# Patient Record
Sex: Female | Born: 1975 | Race: Black or African American | Hispanic: No | Smoking: Never smoker
Health system: Southern US, Community
[De-identification: ages and names within clinical notes are randomized; demographics above are authoritative.]

## PROBLEM LIST (undated history)

## (undated) DIAGNOSIS — K219 Gastro-esophageal reflux disease without esophagitis: Secondary | ICD-10-CM

## (undated) DIAGNOSIS — K6389 Other specified diseases of intestine: Secondary | ICD-10-CM

## (undated) DIAGNOSIS — M549 Dorsalgia, unspecified: Secondary | ICD-10-CM

## (undated) HISTORY — PX: UPPER GASTROINTESTINAL ENDOSCOPY: SHX188

## (undated) HISTORY — DX: Dorsalgia, unspecified: M54.9

## (undated) HISTORY — DX: Gastro-esophageal reflux disease without esophagitis: K21.9

## (undated) HISTORY — PX: NO PAST SURGERIES: SHX2092

---
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
## 2014-05-27 ENCOUNTER — Emergency Department (HOSPITAL_BASED_OUTPATIENT_CLINIC_OR_DEPARTMENT_OTHER): Payer: Medicaid Other

## 2014-05-27 ENCOUNTER — Encounter (HOSPITAL_BASED_OUTPATIENT_CLINIC_OR_DEPARTMENT_OTHER): Payer: Self-pay | Admitting: *Deleted

## 2014-05-27 ENCOUNTER — Emergency Department (HOSPITAL_BASED_OUTPATIENT_CLINIC_OR_DEPARTMENT_OTHER)
Admission: EM | Admit: 2014-05-27 | Discharge: 2014-05-27 | Disposition: A | Discharge status: Home / Self Care | Payer: Medicaid Other | Attending: Emergency Medicine | Admitting: Emergency Medicine

## 2014-05-27 DIAGNOSIS — Y998 Other external cause status: Secondary | ICD-10-CM | POA: Insufficient documentation

## 2014-05-27 DIAGNOSIS — W228XXA Striking against or struck by other objects, initial encounter: Secondary | ICD-10-CM | POA: Insufficient documentation

## 2014-05-27 DIAGNOSIS — Y9301 Activity, walking, marching and hiking: Secondary | ICD-10-CM | POA: Insufficient documentation

## 2014-05-27 DIAGNOSIS — Y9289 Other specified places as the place of occurrence of the external cause: Secondary | ICD-10-CM | POA: Diagnosis not present

## 2014-05-27 DIAGNOSIS — S99921A Unspecified injury of right foot, initial encounter: Secondary | ICD-10-CM | POA: Diagnosis present

## 2014-05-27 DIAGNOSIS — S92514A Nondisplaced fracture of proximal phalanx of right lesser toe(s), initial encounter for closed fracture: Secondary | ICD-10-CM | POA: Insufficient documentation

## 2014-05-27 MED ORDER — HYDROCODONE-ACETAMINOPHEN 5-325 MG PO TABS
1.0000 | ORAL_TABLET | Freq: Once | ORAL | Status: AC
  Administered 2014-05-27: 1 via ORAL
  Filled 2014-05-27: qty 1

## 2014-05-27 MED ORDER — HYDROCODONE-ACETAMINOPHEN 5-325 MG PO TABS: 1.0000 | ORAL_TABLET | ORAL | Status: DC | PRN

## 2014-05-27 NOTE — ED Provider Notes (Signed)
CSN: [id number]     Arrival date & time 05/27/14  1017 History   First MD Initiated Contact with Patient 05/27/14 1022     Chief Complaint  Patient presents with  . Foot Injury     (Consider location/radiation/quality/duration/timing/severity/associated sxs/prior Treatment) HPI Comments: Pt states that she walked into a stool this morning  Patient is a 39 y.o. female presenting with foot injury. The history is provided by the patient. No language interpreter was used.  Foot Injury Location:  Toe Injury: yes   Toe location:  R third toe, R fourth toe and R little toe Pain details:    Quality:  Aching   Radiates to:  Does not radiate   Severity:  Moderate   Onset quality:  Sudden   Timing:  Constant   Progression:  Unchanged Chronicity:  New Dislocation: no   Prior injury to area:  No Relieved by:  Movement Worsened by:  Nothing tried   History reviewed. No pertinent past medical history. History reviewed. No pertinent past surgical history. No family history on file. History  Substance Use Topics  . Smoking status: Never Smoker   . Smokeless tobacco: Never Used  . Alcohol Use: No   OB History    No data available     Review of Systems  All other systems reviewed and are negative.     Allergies  Review of patient's allergies indicates no known allergies.  Home Medications   Prior to Admission medications   Not on File   BP 134/79 mmHg  Pulse 66  Temp(Src) 98 F (36.7 C) (Oral)  Resp 16  Ht 5\' 6"  (1.676 m)  Wt 180 lb (81.647 kg)  BMI 29.07 kg/m2  SpO2 100%  LMP 04/28/2014 (Approximate) Physical Exam  Constitutional: She is oriented to person, place, and time. She appears well-developed and well-nourished.  Cardiovascular: Normal rate and regular rhythm.   Pulmonary/Chest: Effort normal and breath sounds normal.  Musculoskeletal:  No gross deformity noted to toes. Pt has mild swelling of 3rd fourth and 5th toes. Neurovascularly intact   Neurological: She is alert and oriented to person, place, and time.  Skin: Skin is warm and dry.  Vitals reviewed.   ED Course  Procedures (including critical care time) Labs Review Labs Reviewed - No data to display  Imaging Review Dg Foot Complete Right  05/27/2014   CLINICAL DATA:  Stubbed toe on bar stool, initial encounter  EXAM: RIGHT FOOT COMPLETE - 3+ VIEW  COMPARISON:  None.  FINDINGS: No oblique fracture is noted through the fourth proximal phalanx without significant displacement. No other fractures are identified. No gross soft tissue abnormality is seen.  IMPRESSION: Oblique fracture through the fourth proximal phalanx.   Electronically Signed   By: Inez Catalina M.D.   On: 05/27/2014 10:58     EKG Interpretation None      MDM   Final diagnoses:  Closed nondisp fx of proximal phalanx of lesser toe of right foot, initial encounter    Pt neurovascularly intact. Will treat with hydrocodone for pain. Pt toes buddy taped and placed in post op shoe    Glendell Docker, NP 05/27/14 1117  Elnora Morrison, MD 05/29/14 [phone]

## 2014-05-27 NOTE — ED Notes (Signed)
Reports kicked a bar stool this morning while walking in a dark room- pain and bruising to right toes

## 2014-05-27 NOTE — Discharge Instructions (Signed)

## 2014-05-27 NOTE — ED Notes (Signed)
Patient transported to X-ray 

## 2014-05-27 NOTE — ED Notes (Signed)
D/c home with ride- ambulatory with crutches- directed to pharmacy to pick up meds

## 2015-04-19 ENCOUNTER — Encounter (HOSPITAL_BASED_OUTPATIENT_CLINIC_OR_DEPARTMENT_OTHER): Payer: Self-pay | Admitting: *Deleted

## 2015-04-19 ENCOUNTER — Emergency Department (HOSPITAL_BASED_OUTPATIENT_CLINIC_OR_DEPARTMENT_OTHER)
Admission: EM | Admit: 2015-04-19 | Discharge: 2015-04-19 | Disposition: A | Discharge status: Home / Self Care | Payer: Medicaid Other | Attending: Emergency Medicine | Admitting: Emergency Medicine

## 2015-04-19 DIAGNOSIS — H9209 Otalgia, unspecified ear: Secondary | ICD-10-CM | POA: Diagnosis not present

## 2015-04-19 DIAGNOSIS — K0381 Cracked tooth: Secondary | ICD-10-CM | POA: Diagnosis not present

## 2015-04-19 DIAGNOSIS — K0889 Other specified disorders of teeth and supporting structures: Secondary | ICD-10-CM | POA: Diagnosis present

## 2015-04-19 MED ORDER — PENICILLIN V POTASSIUM 500 MG PO TABS: 500.0000 mg | ORAL_TABLET | Freq: Four times a day (QID) | ORAL | Status: DC

## 2015-04-19 MED ORDER — ACETAMINOPHEN 325 MG PO TABS
650.0000 mg | ORAL_TABLET | Freq: Once | ORAL | Status: AC
  Administered 2015-04-19: 650 mg via ORAL
  Filled 2015-04-19: qty 2

## 2015-04-19 MED ORDER — NAPROXEN 250 MG PO TABS: 250.0000 mg | ORAL_TABLET | Freq: Two times a day (BID) | ORAL | Status: DC

## 2015-04-19 NOTE — ED Notes (Signed)
Pt presents with rt lower dental pain, pain radiates to jaw and also has throat pain. States she broke tooth approx 1 year ago, but had no pain, states tooth progressively breaking and now pain in intolerable. States does not have a primary dentist for care.

## 2015-04-19 NOTE — ED Notes (Signed)
Right lower dental pain.  Swelling noted.

## 2015-04-19 NOTE — ED Notes (Signed)
Denies any fevers with this episode

## 2015-04-19 NOTE — ED Notes (Signed)
DC instructions reviewed with pt and significant other, discussed Rx x 2 as written by ED provider, stressed importance of completing all abx as prescribed, also importance of making dental appoint this week with name of DDS by EDP. Opportunity for questions provided

## 2015-04-19 NOTE — ED Provider Notes (Signed)
CSN: [id number]     Arrival date & time 04/19/15  1407 History   First MD Initiated Contact with Patient 04/19/15 1457     Chief Complaint  Patient presents with  . Dental Pain    Navy Desautels is a 40 y.o. female who presents to the emergency department complaining of right lower dental pain for the past year that has worsened over the past 3 days. Patient reports her pain radiates to her right ear. She currently complains of 8 out of 10 pain. She is taking nothing for treatment today. She reports pain with chewing and cold liquids. She denies sore throat or trouble swallowing. She denies fevers, discharge from her mouth, ear discharge, double vision, headache, neck pain, neck stiffness, trouble swallowing, drooling, or vomiting.  Patient is a 40 y.o. female presenting with tooth pain. The history is provided by the patient. No language interpreter was used.  Dental Pain Associated symptoms: no congestion, no drooling, no facial swelling, no fever, no headaches and no neck pain     History reviewed. No pertinent past medical history. History reviewed. No pertinent past surgical history. History reviewed. No pertinent family history. Social History  Substance Use Topics  . Smoking status: Never Smoker   . Smokeless tobacco: Never Used  . Alcohol Use: No   OB History    No data available     Review of Systems  Constitutional: Negative for fever and chills.  HENT: Positive for dental problem and ear pain. Negative for congestion, drooling, ear discharge, facial swelling and sore throat.   Eyes: Negative for visual disturbance.  Respiratory: Negative for cough and shortness of breath.   Gastrointestinal: Negative for vomiting.  Musculoskeletal: Negative for neck pain and neck stiffness.  Skin: Negative for rash.  Neurological: Negative for light-headedness and headaches.      Allergies  Review of patient's allergies indicates no known allergies.  Home Medications   Prior to  Admission medications   Medication Sig Start Date End Date Taking? Authorizing Provider  naproxen (NAPROSYN) 250 MG tablet Take 1 tablet (250 mg total) by mouth 2 (two) times daily with a meal. 04/19/15   Waynetta Pean, PA-C  penicillin v potassium (VEETID) 500 MG tablet Take 1 tablet (500 mg total) by mouth 4 (four) times daily. 04/19/15   Waynetta Pean, PA-C   BP 121/85 mmHg  Pulse 88  Temp(Src) 98.2 F (36.8 C) (Oral)  Resp 20  Ht 5\' 6"  (1.676 m)  Wt 79.379 kg  BMI 28.26 kg/m2  SpO2 100%  LMP 04/02/2015 Physical Exam  Constitutional: She appears well-developed and well-nourished. No distress.  Non-toxic appearing.   HENT:  Head: Normocephalic and atraumatic.  Right Ear: External ear normal.  Left Ear: External ear normal.  Mouth/Throat: Oropharynx is clear and moist. No oropharyngeal exudate.  Tenderness to right lower molar which is cracked. No discharge from the mouth. No facial swelling.  Uvula is midline without edema. Soft palate rises symmetrically. No tonsillar hypertrophy or exudates. Tongue protrusion is normal. No trismus. Bilateral tympanic membranes are pearly-gray without erythema or loss of landmarks.    Eyes: Conjunctivae are normal. Pupils are equal, round, and reactive to light. Right eye exhibits no discharge. Left eye exhibits no discharge.  Neck: Normal range of motion. Neck supple. No JVD present. No tracheal deviation present.  Cardiovascular: Normal rate and intact distal pulses.   Pulmonary/Chest: Effort normal. No respiratory distress.  Lymphadenopathy:    She has no cervical adenopathy.  Neurological: She  is alert. Coordination normal.  Skin: Skin is warm and dry. No rash noted. She is not diaphoretic. No erythema. No pallor.  Psychiatric: She has a normal mood and affect. Her behavior is normal.  Nursing note and vitals reviewed.   ED Course  Procedures (including critical care time) Labs Review Labs Reviewed - No data to display  Imaging  Review No results found. .   EKG Interpretation None      Filed Vitals:   04/19/15 1412  BP: 121/85  Pulse: 88  Temp: 98.2 F (36.8 C)  TempSrc: Oral  Resp: 20  Height: 5\' 6"  (1.676 m)  Weight: 79.379 kg  SpO2: 100%     MDM   Meds given in ED:  Medications  acetaminophen (TYLENOL) tablet 650 mg (not administered)    New Prescriptions   NAPROXEN (NAPROSYN) 250 MG TABLET    Take 1 tablet (250 mg total) by mouth 2 (two) times daily with a meal.   PENICILLIN V POTASSIUM (VEETID) 500 MG TABLET    Take 1 tablet (500 mg total) by mouth 4 (four) times daily.    Final diagnoses:  Pain, dental   This is a 40 y.o. female who presents to the emergency department complaining of right lower dental pain for the past year that has worsened over the past 3 days. Patient with toothache.  No gross abscess.  Exam unconcerning for Ludwig's angina or spread of infection.  Will treat with penicillin and pain medicine.  Urged patient to follow-up with dentist Dr. Geralynn Ochs and take her discharge instructions to her appointment. I advised the patient to follow-up with their primary care provider this week. I advised the patient to return to the emergency department with new or worsening symptoms or new concerns. The patient verbalized understanding and agreement with plan.      Waynetta Pean, PA-C 04/19/15 [place], MD 04/19/15 2250

## 2015-04-19 NOTE — Discharge Instructions (Signed)

## 2019-03-09 DIAGNOSIS — K6389 Other specified diseases of intestine: Secondary | ICD-10-CM

## 2019-03-09 HISTORY — DX: Other specified diseases of intestine: K63.89

## 2019-04-23 ENCOUNTER — Other Ambulatory Visit: Payer: Self-pay

## 2019-04-23 ENCOUNTER — Emergency Department (HOSPITAL_BASED_OUTPATIENT_CLINIC_OR_DEPARTMENT_OTHER): Payer: Self-pay

## 2019-04-23 ENCOUNTER — Emergency Department (HOSPITAL_BASED_OUTPATIENT_CLINIC_OR_DEPARTMENT_OTHER)
Admission: EM | Admit: 2019-04-23 | Discharge: 2019-04-23 | Disposition: A | Discharge status: Home / Self Care | Payer: Self-pay | Attending: Emergency Medicine | Admitting: Emergency Medicine

## 2019-04-23 ENCOUNTER — Encounter (HOSPITAL_BASED_OUTPATIENT_CLINIC_OR_DEPARTMENT_OTHER): Payer: Self-pay | Admitting: *Deleted

## 2019-04-23 DIAGNOSIS — Z20822 Contact with and (suspected) exposure to covid-19: Secondary | ICD-10-CM | POA: Insufficient documentation

## 2019-04-23 DIAGNOSIS — R1900 Intra-abdominal and pelvic swelling, mass and lump, unspecified site: Secondary | ICD-10-CM | POA: Insufficient documentation

## 2019-04-23 DIAGNOSIS — R0789 Other chest pain: Secondary | ICD-10-CM | POA: Insufficient documentation

## 2019-04-23 DIAGNOSIS — B349 Viral infection, unspecified: Secondary | ICD-10-CM | POA: Insufficient documentation

## 2019-04-23 DIAGNOSIS — Z79899 Other long term (current) drug therapy: Secondary | ICD-10-CM | POA: Insufficient documentation

## 2019-04-23 LAB — CBC WITH DIFFERENTIAL/PLATELET
Abs Immature Granulocytes: 0.02 10*3/uL (ref 0.00–0.07)
Basophils Absolute: 0 10*3/uL (ref 0.0–0.1)
Basophils Relative: 0 %
Eosinophils Absolute: 0 10*3/uL (ref 0.0–0.5)
Eosinophils Relative: 0 %
HCT: 37.9 % (ref 36.0–46.0)
Hemoglobin: 11.9 g/dL — ABNORMAL LOW (ref 12.0–15.0)
Immature Granulocytes: 0 %
Lymphocytes Relative: 4 %
Lymphs Abs: 0.3 10*3/uL — ABNORMAL LOW (ref 0.7–4.0)
MCH: 25.7 pg — ABNORMAL LOW (ref 26.0–34.0)
MCHC: 31.4 g/dL (ref 30.0–36.0)
MCV: 81.9 fL (ref 80.0–100.0)
Monocytes Absolute: 0.1 10*3/uL (ref 0.1–1.0)
Monocytes Relative: 2 %
Neutro Abs: 7.1 10*3/uL (ref 1.7–7.7)
Neutrophils Relative %: 94 %
Platelets: 262 10*3/uL (ref 150–400)
RBC: 4.63 MIL/uL (ref 3.87–5.11)
RDW: 15 % (ref 11.5–15.5)
WBC: 7.6 10*3/uL (ref 4.0–10.5)
nRBC: 0 % (ref 0.0–0.2)

## 2019-04-23 LAB — COMPREHENSIVE METABOLIC PANEL
ALT: 33 U/L (ref 0–44)
AST: 35 U/L (ref 15–41)
Albumin: 4.5 g/dL (ref 3.5–5.0)
Alkaline Phosphatase: 70 U/L (ref 38–126)
Anion gap: 11 (ref 5–15)
BUN: 11 mg/dL (ref 6–20)
CO2: 25 mmol/L (ref 22–32)
Calcium: 9.5 mg/dL (ref 8.9–10.3)
Chloride: 103 mmol/L (ref 98–111)
Creatinine, Ser: 0.76 mg/dL (ref 0.44–1.00)
GFR calc Af Amer: >60 mL/min (ref >60)
GFR calc non Af Amer: >60 mL/min (ref >60)
Glucose, Bld: 99 mg/dL (ref 70–99)
Potassium: 4.2 mmol/L (ref 3.5–5.1)
Sodium: 139 mmol/L (ref 135–145)
Total Bilirubin: 0.8 mg/dL (ref 0.3–1.2)
Total Protein: 7.8 g/dL (ref 6.5–8.1)

## 2019-04-23 LAB — D-DIMER, QUANTITATIVE (NOT AT ARMC): D-Dimer, Quant: 1.04 ug/mL-FEU — ABNORMAL HIGH (ref 0.00–0.50)

## 2019-04-23 LAB — TROPONIN I (HIGH SENSITIVITY)
Troponin I (High Sensitivity): <2 ng/L (ref <18)
Troponin I (High Sensitivity): <2 ng/L (ref <18)

## 2019-04-23 LAB — SARS CORONAVIRUS 2 AG (30 MIN TAT): SARS Coronavirus 2 Ag: NEGATIVE

## 2019-04-23 MED ORDER — IOHEXOL 350 MG/ML SOLN
75.0000 mL | Freq: Once | INTRAVENOUS | Status: AC | PRN
  Administered 2019-04-23: 75 mL via INTRAVENOUS

## 2019-04-23 MED ORDER — PROMETHAZINE HCL 25 MG/ML IJ SOLN
25.0000 mg | Freq: Once | INTRAMUSCULAR | Status: AC
  Administered 2019-04-23: 25 mg via INTRAVENOUS
  Filled 2019-04-23: qty 1

## 2019-04-23 MED ORDER — ONDANSETRON 4 MG PO TBDP: 4.0000 mg | ORAL_TABLET | Freq: Three times a day (TID) | ORAL | 0 refills | Status: DC | PRN

## 2019-04-23 MED ORDER — ACETAMINOPHEN 325 MG PO TABS
650.0000 mg | ORAL_TABLET | Freq: Once | ORAL | Status: AC
  Administered 2019-04-23: 650 mg via ORAL
  Filled 2019-04-23: qty 2

## 2019-04-23 MED ORDER — SODIUM CHLORIDE 0.9 % IV SOLN: Freq: Once | INTRAVENOUS | Status: AC

## 2019-04-23 MED ORDER — IOHEXOL 300 MG/ML  SOLN: 75.0000 mL | Freq: Once | INTRAMUSCULAR | Status: DC | PRN

## 2019-04-23 MED ORDER — IOHEXOL 300 MG/ML  SOLN
100.0000 mL | Freq: Once | INTRAMUSCULAR | Status: AC | PRN
  Administered 2019-04-23: 80 mL via INTRAVENOUS

## 2019-04-23 MED ORDER — SODIUM CHLORIDE 0.9 % IV BOLUS
1000.0000 mL | Freq: Once | INTRAVENOUS | Status: AC
  Administered 2019-04-23: 1000 mL via INTRAVENOUS

## 2019-04-23 NOTE — Discharge Instructions (Addendum)
You were seen today for vomiting and chest discomfort.  Your work-up is reassuring.  You did develop a fever while in the emergency room.  You may have a virus causing your symptoms.  Your initial COVID-19 test was negative.  Repeat testing was sent.  You should quarantine until repeat testing returns which should be tomorrow.  Take Zofran as needed for any nausea or vomiting.  Make sure that you are staying hydrated.  Your CT scans unfortunately show a mass in your abdomen. We cannot tell for sure what it is based on the imaging but cancer is a possibility. Certain tumors could also cause the symptoms you are experiencing.   Take ibuprofen/tylenol as needed for fever. Zofran and as needed for nausea. You need to make an appointment with oncology to discuss further and you will likely end up needing a biopsy of this mass to make a definitive diagnosis. Return to the ER if you feel like your symptoms are significantly worsening in the mean time.

## 2019-04-23 NOTE — ED Triage Notes (Addendum)
C/o vomiting times 4 that started a few hours ago. C/o feeling " chilled" and short of breath. C/o anterior chest pain. States she feels like something is "stuck in her chest" denies any urinary symptoms. States pain is constant.

## 2019-04-23 NOTE — ED Notes (Signed)
Pt given water for PO challenge. Reports nausea has subsided. Ambulated to bathroom with staff.

## 2019-04-23 NOTE — ED Provider Notes (Signed)
[facility] EMERGENCY DEPARTMENT Provider Note   CSN: [id number] Arrival date & time: 04/23/19  0051     History Chief Complaint  Patient presents with  . chest pain and vomiting    Amy Frost is a 44 y.o. female.  HPI    This is a 44 year old female with no reported past medical history who presents with multiple episodes of nonbilious, nonbloody emesis.  Patient reports onset of symptoms abruptly several hours ago.  She states she began to have chills and felt like she needed to go to bed.  She developed some shortness of breath and anterior chest pain.  She states "I feel like something is just sitting there."  She has had several episodes of nonbilious, nonbloody emesis.  She denies any recent cough or shortness of breath.  Denies any known Covid exposures.  History reviewed. No pertinent past medical history.  There are no problems to display for this patient.   History reviewed. No pertinent surgical history.   OB History   No obstetric history on file.     No family history on file.  Social History   Tobacco Use  . Smoking status: Never Smoker  . Smokeless tobacco: Never Used  Substance Use Topics  . Alcohol use: No  . Drug use: No    Home Medications Prior to Admission medications   Medication Sig Start Date End Date Taking? Authorizing Provider  naproxen (NAPROSYN) 250 MG tablet Take 1 tablet (250 mg total) by mouth 2 (two) times daily with a meal. 04/19/15   Waynetta Pean, PA-C  ondansetron (ZOFRAN ODT) 4 MG disintegrating tablet Take 1 tablet (4 mg total) by mouth every 8 (eight) hours as needed. 04/23/19   [place] Moss, Barbette Hair, MD  penicillin v potassium (VEETID) 500 MG tablet Take 1 tablet (500 mg total) by mouth 4 (four) times daily. 04/19/15   Waynetta Pean, PA-C    Allergies    Patient has no known allergies.  Review of Systems   Review of Systems  Constitutional: Positive for chills. Negative for fever.  Respiratory: Positive for  shortness of breath.   Cardiovascular: Positive for chest pain. Negative for leg swelling.  Gastrointestinal: Positive for nausea and vomiting. Negative for abdominal pain, constipation and diarrhea.  Genitourinary: Negative for dysuria.  All other systems reviewed and are negative.   Physical Exam Updated Vital Signs BP 104/68 (BP Location: Right Arm)   Pulse 94   Temp (!) 101.3 F (38.5 C)   Resp 20   Ht 1.651 m (5\' 5" )   Wt 81.6 kg   LMP  (LMP Unknown)   SpO2 98%   BMI 29.95 kg/m   Physical Exam Vitals and nursing note reviewed.  Constitutional:      Appearance: She is well-developed.     Comments: Overweight  HENT:     Head: Normocephalic and atraumatic.     Mouth/Throat:     Mouth: Mucous membranes are moist.  Eyes:     Pupils: Pupils are equal, round, and reactive to light.  Cardiovascular:     Rate and Rhythm: Regular rhythm. Tachycardia present.     Heart sounds: Normal heart sounds.  Pulmonary:     Effort: Pulmonary effort is normal. No respiratory distress.     Breath sounds: No wheezing.  Chest:     Chest wall: No tenderness.  Abdominal:     General: Bowel sounds are normal.     Palpations: Abdomen is soft.     Tenderness:  There is no abdominal tenderness. There is no guarding or rebound.  Musculoskeletal:     Cervical back: Neck supple.     Right lower leg: No edema.     Left lower leg: No edema.  Skin:    General: Skin is warm and dry.  Neurological:     Mental Status: She is alert and oriented to person, place, and time.  Psychiatric:        Mood and Affect: Mood normal.     ED Results / Procedures / Treatments   Labs (all labs ordered are listed, but only abnormal results are displayed) Labs Reviewed  CBC WITH DIFFERENTIAL/PLATELET - Abnormal; Notable for the following components:      Result Value   Hemoglobin 11.9 (*)    MCH 25.7 (*)    Lymphs Abs 0.3 (*)    All other components within normal limits  D-DIMER, QUANTITATIVE (NOT AT  [facility]) - Abnormal; Notable for the following components:   D-Dimer, Quant 1.04 (*)    All other components within normal limits  SARS CORONAVIRUS 2 AG (30 MIN TAT)  NOVEL CORONAVIRUS, NAA (HOSP ORDER, SEND-OUT TO REF LAB; TAT 18-24 HRS)  COMPREHENSIVE METABOLIC PANEL  TROPONIN I (HIGH SENSITIVITY)  TROPONIN I (HIGH SENSITIVITY)    EKG EKG Interpretation  Date/Time:  Monday April 23 2019 01:17:06 EST Ventricular Rate:  109 PR Interval:    QRS Duration: 81 QT Interval:  326 QTC Calculation: 439 R Axis:   48 Text Interpretation: Sinus tachycardia Borderline T abnormalities, diffuse leads Confirmed by Thayer Jew [phone]) on 04/23/2019 1:24:10 AM   Radiology CT Angio Chest PE W and/or Wo Contrast  Result Date: 04/23/2019 CLINICAL DATA:  Shortness of breath EXAM: CT ANGIOGRAPHY CHEST WITH CONTRAST TECHNIQUE: Multidetector CT imaging of the chest was performed using the standard protocol during bolus administration of intravenous contrast. Multiplanar CT image reconstructions and MIPs were obtained to evaluate the vascular anatomy. CONTRAST:  54mL OMNIPAQUE IOHEXOL 350 MG/ML SOLN COMPARISON:  None. FINDINGS: Cardiovascular: Evaluation for pulmonary emboli was limited by respiratory motion artifact.Given this limitation, no definite acute pulmonary embolism was detected. The main pulmonary artery is within normal limits for size. There is no CT evidence of acute right heart strain. The visualized aorta is normal. Heart size is normal, without pericardial effusion. Mediastinum/Nodes: --No mediastinal or hilar lymphadenopathy. --No axillary lymphadenopathy. --No supraclavicular lymphadenopathy. --Normal thyroid gland. --The esophagus is unremarkable Lungs/Pleura: No pulmonary nodules or masses. No pleural effusion or pneumothorax. No focal airspace consolidation. No focal pleural abnormality. Upper Abdomen: There is a hypoattenuating 2.9 by 2.7 cm lesion in hepatic segment 8/7 (axial series 6,  image 57). There is suggestion of peripheral nodular hyperenhancement surrounding this lesion and is therefore favored to represent a benign hemangioma.There is a hyperenhancing 5.2 by 3 cm mass in the anterior abdomen, anterior to the SMA, in the root of the mesentery. There are multiple enlarged draining veins that appear to connect to the portal venous confluence. Musculoskeletal: No chest wall abnormality. No acute or significant osseous findings. Review of the MIP images confirms the above findings. IMPRESSION: 1. Limited study for the detection of pulmonary emboli as detailed above. Given this limitation, no acute pulmonary embolism was detected. 2. There is a 5.2 x 3 cm hyperenhancing mass in the abdomen as detailed above. Differential considerations include but are not limited to a desmoid or carcinoid tumor. 3. Indeterminate 3 cm mass in the right hepatic lobe. Follow-up with a nonemergent contrast-enhanced MRI is  recommended. Electronically Signed   By: Constance Holster M.D.   On: 04/23/2019 03:19   DG Chest Portable 1 View  Result Date: 04/23/2019 CLINICAL DATA:  Chest pain. Chills and shortness of breath. Vomiting. EXAM: PORTABLE CHEST 1 VIEW COMPARISON:  None. FINDINGS: The cardiomediastinal contours are normal. The lungs are clear. Pulmonary vasculature is normal. No consolidation, pleural effusion, or pneumothorax. No acute osseous abnormalities are seen. IMPRESSION: Negative AP view of the chest. Electronically Signed   By: Keith Rake M.D.   On: 04/23/2019 01:57    Procedures Procedures (including critical care time)  Medications Ordered in ED Medications  iohexol (OMNIPAQUE) 300 MG/ML solution 75 mL (has no administration in time range)  sodium chloride 0.9 % bolus 1,000 mL (1,000 mLs Intravenous New Bag/Given 04/23/19 0139)  promethazine (PHENERGAN) injection 25 mg (25 mg Intravenous Given 04/23/19 0200)  acetaminophen (TYLENOL) tablet 650 mg (650 mg Oral Given 04/23/19 0218)    iohexol (OMNIPAQUE) 350 MG/ML injection 75 mL (75 mLs Intravenous Contrast Given 04/23/19 0226)    ED Course  I have reviewed the triage vital signs and the nursing notes.  Pertinent labs & imaging results that were available during my care of the patient were reviewed by me and considered in my medical decision making (see chart for details).  Clinical Course as of Apr 23 443  Mon Apr 23, 2019  [id number] Patient with CT negative for PE.  However, upper abdomen with concerning mass.  This may or may not be related to her presentation today.  She does not have insurance or primary physician.  Will obtain CT to better characterize.  Unfortunately, patient received 2 boluses of contrast for her PE study.  Per radiology, she needs to be hydrated and cannot receive another bolus for at least 8 hours from her prior scan.  Given that she does not have definitive follow-up or primary physician and is without insurance, feel it necessary to characterize this mass to appropriately provide patient with follow-up instructions.   [CH]    Clinical Course User Index [CH] Sharne Linders, Barbette Hair, MD   MDM Rules/Calculators/A&P                       Patient presents with fairly acute onset nausea, vomiting, chest pain.  She is overall nontoxic and initial vital signs are reassuring.  She is initially afebrile.  However, she continued to endorse chills.  Temperature on recheck was 101.3.  She denies any Covid exposures and is low risk.  Initial Covid testing is negative.  EKG is nonischemic and without arrhythmia.  Chest x-ray without pneumothorax or pneumonia.  Given her tachycardia, D-dimer was obtained and is slightly elevated.  CT PE study obtained.  Her basic lab work is largely unremarkable.  No significant metabolic derangements.  CT was limited but no obvious PE.  However, she was found to have incidental abdominal mass and a liver lesion.  For this reason, I ordered CT of her abdomen.  See clinical course above.   Patient will be hydrated for repeat CT at 11 AM.  Final Clinical Impression(s) / ED Diagnoses Final diagnoses:  Viral syndrome  Abdominal mass, unspecified abdominal location    Rx / DC Orders ED Discharge Orders         Ordered    ondansetron (ZOFRAN ODT) 4 MG disintegrating tablet  Every 8 hours PRN     04/23/19 0423  Merryl Hacker, MD 04/23/19 [phone]

## 2019-04-24 LAB — NOVEL CORONAVIRUS, NAA (HOSP ORDER, SEND-OUT TO REF LAB; TAT 18-24 HRS): SARS-CoV-2, NAA: NOT DETECTED

## 2019-04-25 ENCOUNTER — Telehealth: Payer: Self-pay | Admitting: Hematology

## 2019-04-25 NOTE — Telephone Encounter (Signed)
Received a call from the pt who was seen in the ED for an abdominal mass. She has been scheduled to see Dr. Burr Medico on 2/24 at 3pm. Pt aware to arrive 15 minutes early.

## 2019-04-26 ENCOUNTER — Other Ambulatory Visit: Payer: Self-pay | Admitting: Oncology

## 2019-04-26 DIAGNOSIS — K6389 Other specified diseases of intestine: Secondary | ICD-10-CM

## 2019-04-26 NOTE — Progress Notes (Signed)
I sent a request to our scheduler for patient to be seen 02/26 with labs that day

## 2019-04-27 NOTE — Progress Notes (Signed)
[place]   [phone] [phone]   Clinic New Consult Note   Patient Care Team: Patient, No Pcp Per as PCP - General (General Practice) Jonnie Finner, RN as Oncology Nurse Navigator  Date of Service:  05/02/2019   CHIEF COMPLAINTS/PURPOSE OF CONSULTATION:  Mesenteric Mass   REFERRING PHYSICIAN:  ED physician   HISTORY OF PRESENTING ILLNESS:  Amy Frost 44 y.o. female is a here because of mesenteric mass. The patient was referred by Hospitalist. The patient presents to the clinic today alone.   She was having N&V and SOB with chest tightness and had extreme chills. She did not initially have fever but had one in ER. She denies any other symptoms prior to the day she went to hospital. She did cook and eat shrimp that day. Her symptoms resolved after a few hours and IVFs. Since that day she  not had any other GI issues except bad gas that resolved with Rolaids. She denies diarrhea/constipation or blood in stool. She notes overall she has no current issues.    Socially married with 7 children ranging from age 66-17 that live with her. She does not smoke or drink alcohol. She is currently not working. She previously worked for [employer]. Her husband is a Administrator.   She denies any medical issues or on medication. After she lost her insurance she has not seen PCP or Gyn. 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 She has never had surgery before. She denies family history of malignancy or disease running in her family. She notes she had a heart murmur in the past. She notes her periods last 3-4 days and are only heavy every other month and last period was very bight red.    REVIEW OF SYSTEMS:    Constitutional: Denies fevers, chills or abnormal night sweats Eyes: Denies blurriness of vision, double vision or watery eyes Ears, nose, mouth, throat, and face: Denies mucositis or sore throat Respiratory: Denies cough, dyspnea or  wheezes Cardiovascular: Denies palpitation, chest discomfort or lower extremity swelling Gastrointestinal:  Denies nausea, heartburn or change in bowel habits Skin: Denies abnormal skin rashes Lymphatics: Denies new lymphadenopathy or easy bruising Neurological:Denies numbness, tingling or new weaknesses Behavioral/Psych: Mood is stable, no new changes  All other systems were reviewed with the patient and are negative.   MEDICAL HISTORY:  History reviewed. No pertinent past medical history.  SURGICAL HISTORY: History reviewed. No pertinent surgical history.  SOCIAL HISTORY: Social History   Socioeconomic History  . Marital status: Married    Spouse name: Not on file  . Number of children: 7  . Years of education: Not on file  . Highest education level: Not on file  Occupational History  . Not on file  Tobacco Use  . Smoking status: Never Smoker  . Smokeless tobacco: Never Used  Substance and Sexual Activity  . Alcohol use: No  . Drug use: No  . Sexual activity: Not on file  Other Topics Concern  . Not on file  Social History Narrative  . Not on file   Social Determinants of Health   Financial Resource Strain:   . Difficulty of Paying Living Expenses: Not on file  Food Insecurity:   . Worried About Charity fundraiser in the Last Year: Not on file  . Ran Out of Food in the Last Year: Not on file  Transportation Needs:   . Lack of Transportation (Medical):  Not on file  . Lack of Transportation (Non-Medical): Not on file  Physical Activity:   . Days of Exercise per Week: Not on file  . Minutes of Exercise per Session: Not on file  Stress:   . Feeling of Stress : Not on file  Social Connections:   . Frequency of Communication with Friends and Family: Not on file  . Frequency of Social Gatherings with Friends and Family: Not on file  . Attends Religious Services: Not on file  . Active Member of Clubs or Organizations: Not on file  . Attends Archivist  Meetings: Not on file  . Marital Status: Not on file  Intimate Partner Violence:   . Fear of Current or Ex-Partner: Not on file  . Emotionally Abused: Not on file  . Physically Abused: Not on file  . Sexually Abused: Not on file    FAMILY HISTORY: History reviewed. No pertinent family history.  ALLERGIES:  has No Known Allergies.  MEDICATIONS:  Current Outpatient Medications  Medication Sig Dispense Refill  . naproxen (NAPROSYN) 250 MG tablet Take 1 tablet (250 mg total) by mouth 2 (two) times daily with a meal. 30 tablet 0  . ondansetron (ZOFRAN ODT) 4 MG disintegrating tablet Take 1 tablet (4 mg total) by mouth every 8 (eight) hours as needed. 20 tablet 0   No current facility-administered medications for this visit.    PHYSICAL EXAMINATION: ECOG PERFORMANCE STATUS: 0 - Asymptomatic  Vitals:   05/02/19 1446  BP: 135/84  Pulse: 73  Resp: 18  Temp: 98.2 F (36.8 C)  SpO2: 100%   Filed Weights   05/02/19 1446  Weight: 214 lb 6.4 oz (97.3 kg)    GENERAL:alert, no distress and comfortable SKIN: skin color, texture, turgor are normal, no rashes or significant lesions EYES: normal, Conjunctiva are pink and non-injected, sclera clear  NECK: supple, thyroid normal size, non-tender, without nodularity LYMPH:  no palpable lymphadenopathy in the cervical, axillary  LUNGS: clear to auscultation and percussion with normal breathing effort HEART: regular rate & rhythm and no murmurs and no lower extremity edema ABDOMEN:abdomen soft, non-tender and normal bowel sounds Musculoskeletal:no cyanosis of digits and no clubbing  NEURO: alert & oriented x 3 with fluent speech, no focal motor/sensory deficits  LABORATORY DATA:  I have reviewed the data as listed CBC Latest Ref Rng & Units 04/23/2019  WBC 4.0 - 10.5 K/uL 7.6  Hemoglobin 12.0 - 15.0 g/dL 11.9(L)  Hematocrit 36.0 - 46.0 % 37.9  Platelets 150 - 400 K/uL 262    CMP Latest Ref Rng & Units 04/23/2019  Glucose 70 - 99  mg/dL 99  BUN 6 - 20 mg/dL 11  Creatinine 0.44 - 1.00 mg/dL 0.76  Sodium 135 - 145 mmol/L 139  Potassium 3.5 - 5.1 mmol/L 4.2  Chloride 98 - 111 mmol/L 103  CO2 22 - 32 mmol/L 25  Calcium 8.9 - 10.3 mg/dL 9.5  Total Protein 6.5 - 8.1 g/dL 7.8  Total Bilirubin 0.3 - 1.2 mg/dL 0.8  Alkaline Phos 38 - 126 U/L 70  AST 15 - 41 U/L 35  ALT 0 - 44 U/L 33    CT Angio Chest 04/23/19  IMPRESSION: 1. Limited study for the detection of pulmonary emboli as detailed above. Given this limitation, no acute pulmonary embolism was detected. 2. There is a 5.2 x 3 cm hyperenhancing mass in the abdomen as detailed above. Differential considerations include but are not limited to a desmoid or carcinoid tumor. 3.  Indeterminate 3 cm mass in the right hepatic lobe. Follow-up with a nonemergent contrast-enhanced MRI is recommended.    CT AP W contrast 04/23/19  IMPRESSION: 1. No acute findings identified within the abdomen or pelvis. 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 This is of uncertain etiology. Differential considerations include desmoid tumor, carcinoid tumor, GIST, lymphoma, or metastatic adenopathy. 2. There is a low-density mass with peripheral enhancement within the dome of liver which is favored to represent a benign abnormality such as hemangioma. This could be confirmed with nonemergent contrast enhanced liver protocol MRI.   RADIOGRAPHIC STUDIES: I have personally reviewed the radiological images as listed and agreed with the findings in the report. CT Angio Chest PE W and/or Wo Contrast  Result Date: 04/23/2019 CLINICAL DATA:  Shortness of breath EXAM: CT ANGIOGRAPHY CHEST WITH CONTRAST TECHNIQUE: Multidetector CT imaging of the chest was performed using the standard protocol during bolus administration of intravenous contrast. Multiplanar CT image reconstructions and MIPs were obtained to evaluate the vascular anatomy. CONTRAST:  34mL OMNIPAQUE IOHEXOL 350 MG/ML SOLN COMPARISON:   None. FINDINGS: Cardiovascular: Evaluation for pulmonary emboli was limited by respiratory motion artifact.Given this limitation, no definite acute pulmonary embolism was detected. The main pulmonary artery is within normal limits for size. There is no CT evidence of acute right heart strain. The visualized aorta is normal. Heart size is normal, without pericardial effusion. Mediastinum/Nodes: --No mediastinal or hilar lymphadenopathy. --No axillary lymphadenopathy. --No supraclavicular lymphadenopathy. --Normal thyroid gland. --The esophagus is unremarkable Lungs/Pleura: No pulmonary nodules or masses. No pleural effusion or pneumothorax. No focal airspace consolidation. No focal pleural abnormality. Upper Abdomen: There is a hypoattenuating 2.9 by 2.7 cm lesion in hepatic segment 8/7 (axial series 6, image 57). There is suggestion of peripheral nodular hyperenhancement surrounding this lesion and is therefore favored to represent a benign hemangioma.There is a hyperenhancing 5.2 by 3 cm mass in the anterior abdomen, anterior to the SMA, in the root of the mesentery. There are multiple enlarged draining veins that appear to connect to the portal venous confluence. Musculoskeletal: No chest wall abnormality. No acute or significant osseous findings. Review of the MIP images confirms the above findings. IMPRESSION: 1. Limited study for the detection of pulmonary emboli as detailed above. Given this limitation, no acute pulmonary embolism was detected. 2. There is a 5.2 x 3 cm hyperenhancing mass in the abdomen as detailed above. Differential considerations include but are not limited to a desmoid or carcinoid tumor. 3. Indeterminate 3 cm mass in the right hepatic lobe. Follow-up with a nonemergent contrast-enhanced MRI is recommended. Electronically Signed   By: Constance Holster M.D.   On: 04/23/2019 03:19   CT ABDOMEN PELVIS W CONTRAST  Result Date: 04/23/2019 CLINICAL DATA:  Vomiting. Short of breath.  Anterior chest pain. Abdominal mass EXAM: CT ABDOMEN AND PELVIS WITH CONTRAST TECHNIQUE: Multidetector CT imaging of the abdomen and pelvis was performed using the standard protocol following bolus administration of intravenous contrast. CONTRAST:  59mL OMNIPAQUE IOHEXOL 300 MG/ML  SOLN COMPARISON:  None. FINDINGS: Lower chest: No acute abnormality. Hepatobiliary: Within the lateral dome of liver there is a low-density lesion with peripheral and nodular enhancement which measures 3.0 x 3.0 cm, image 7/2. The focal fatty deposition noted adjacent to the falciform ligament. Vicarious excretion of contrast noted within the gallbladder. No gallbladder wall inflammation or biliary ductal dilatation. Pancreas: Unremarkable. No pancreatic ductal dilatation or surrounding inflammatory changes. Spleen: Normal in size without focal abnormality. Adrenals/Urinary Tract: Normal adrenal glands. The kidneys are  unremarkable. No mass or hydronephrosis identified bilaterally. Small low-density structure within posterior cortex of left kidney is too small to characterize measuring 7 mm, image 19/7. The urinary bladder is unremarkable. Stomach/Bowel: Stomach is normal. The small bowel loops are unremarkable. The appendix is visualized and appears normal. No wall thickening, inflammation or abnormal distension. Vascular/Lymphatic: Abdominal aorta is unremarkable. Soft tissue mass within the central mesentery is again identified. This measures 5.4 x 2.8 by 5.9 cm (volume = 47 cm^3). Reproductive: Uterus and bilateral adnexa are unremarkable. Other: There is no ascites or focal fluid collection identified within the abdomen or pelvis. Musculoskeletal: No acute or significant osseous findings. IMPRESSION: 1. No acute findings identified within the abdomen or pelvis. 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 This is of uncertain etiology. Differential considerations include desmoid tumor, carcinoid tumor, GIST, lymphoma, or metastatic  adenopathy. 2. There is a low-density mass with peripheral enhancement within the dome of liver which is favored to represent a benign abnormality such as hemangioma. This could be confirmed with nonemergent contrast enhanced liver protocol MRI. Electronically Signed   By: Kerby Moors M.D.   On: 04/23/2019 11:42   DG Chest Portable 1 View  Result Date: 04/23/2019 CLINICAL DATA:  Chest pain. Chills and shortness of breath. Vomiting. EXAM: PORTABLE CHEST 1 VIEW COMPARISON:  None. FINDINGS: The cardiomediastinal contours are normal. The lungs are clear. Pulmonary vasculature is normal. No consolidation, pleural effusion, or pneumothorax. No acute osseous abnormalities are seen. IMPRESSION: Negative AP view of the chest. Electronically Signed   By: Keith Rake M.D.   On: 04/23/2019 01:57    ASSESSMENT & PLAN:  Elaine Ratterree is a 44 y.o. African-American female with no significant PMHx.     1. Mesenteric Mass  -this is likely incidental finding. I personally reviewed and discussed her image findings. Her CT angio chest and CT AP show 5.9cm mesenteric mass. It does not appear to be involving attached to her small bowel, could be a lymph node but no other adenopathy on CT  -I discussed this could be benign such as desmoid tumor or Malignant, such as low grade lymphoma, GIST, Neuroendocrine tumor or metastatic cancer. I reviewed her case in GI tumor Board this morning and needle biopsy is recommended for further evaluation. She is agreeable.  -She did have an episode of N&V and chest tightness which has completely resolved. This may not be related to this mass.  -I discussed if she is found to have malignancy, treatment will be offered. Due to the location, complete surgical resection may not be easy.  If mass is benign, we will likely just monitor it.  -Physical exam unremarkable. Labs reviewed from last week, Hg 11.9 otherwise normal.  -Will F/u with phone call after her biopsy.    2. Mild  Anemia  -She notes to having heavy menses every other month.  -Labs from 04/23/19 show Hg 11.9.  -I recommend she take multivitamin with iron.    3. Financial support -She previously worked for [employer]. She is currently not working. She is nervous about working outside of home  -She has not had medical insurance for the past 2 years and has not been seen by PCP or Gyn. She did apply for insurance in 01/2019 with [employer] but nor sure if they cover anything.  -I discussed she can speak with our financial advocate or SW about more information.  -I recommend she establish a PCP under [facility]    4. Over all health, Cancer screenings -She  is overall healthy with no significant PMHx or FMHx. 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 She has not had surgery for removal as this is cosmetic surgery that her insurance does not cover -She has not had a mammogram before. She is of age and I can look into assistance or her insurance coverage for her to have a Mammogram yearly. She is interested.    PLAN:  -IR CT biopsy of mesenteric mass in 1-2 weeks  -phone visit in 2-3 days after biopsy    Orders Placed This Encounter  Procedures  . CT Biopsy    Standing Status:   Future    Standing Expiration Date:   05/01/2020    Scheduling Instructions:     Case discussed in GI conference this morning, Dr. Annamaria Boots approved the biopsy    Order Specific Question:   Lab orders requested (DO NOT place separate lab orders, these will be automatically ordered during procedure specimen collection):    Answer:   Surgical Pathology    Order Specific Question:   Reason for Exam (SYMPTOM  OR DIAGNOSIS REQUIRED)    Answer:   mesentary mass, biopsy for tussue dignosis rule out malignancy    Order Specific Question:   Is patient pregnant?    Answer:   No    Order Specific Question:   Preferred location?    Answer:   [facility]    Order Specific Question:   Radiology Contrast Protocol - do NOT remove file path     Answer:   [email or web address]    All questions were answered. The patient knows to call the clinic with any problems, questions or concerns. The total time spent in the appointment was 50 minutes.     Truitt Merle, MD 05/02/2019 11:14 PM  I, Joslyn Devon, am acting as scribe for Truitt Merle, MD.   I have reviewed the above documentation for accuracy and completeness, and I agree with the above.

## 2019-05-02 ENCOUNTER — Inpatient Hospital Stay: Payer: Self-pay | Attending: Hematology | Admitting: Hematology

## 2019-05-02 ENCOUNTER — Other Ambulatory Visit: Payer: Self-pay

## 2019-05-02 ENCOUNTER — Encounter: Payer: Self-pay | Admitting: Hematology

## 2019-05-02 VITALS — BP 135/84 | HR 73 | Temp 98.2°F | Resp 18 | Ht 65.5 in | Wt 214.4 lb

## 2019-05-02 DIAGNOSIS — R160 Hepatomegaly, not elsewhere classified: Secondary | ICD-10-CM | POA: Insufficient documentation

## 2019-05-02 DIAGNOSIS — R222 Localized swelling, mass and lump, trunk: Secondary | ICD-10-CM | POA: Insufficient documentation

## 2019-05-02 DIAGNOSIS — Z79899 Other long term (current) drug therapy: Secondary | ICD-10-CM | POA: Insufficient documentation

## 2019-05-02 DIAGNOSIS — D649 Anemia, unspecified: Secondary | ICD-10-CM | POA: Insufficient documentation

## 2019-05-02 DIAGNOSIS — D171 Benign lipomatous neoplasm of skin and subcutaneous tissue of trunk: Secondary | ICD-10-CM | POA: Insufficient documentation

## 2019-05-02 DIAGNOSIS — N920 Excessive and frequent menstruation with regular cycle: Secondary | ICD-10-CM | POA: Insufficient documentation

## 2019-05-02 DIAGNOSIS — K6389 Other specified diseases of intestine: Secondary | ICD-10-CM

## 2019-05-02 NOTE — Progress Notes (Signed)
Spoke with patient at her initial appointment with Dr. Burr Medico.  She was given my direct contact number.  She is worried about insurance and how things will be paid for.  I reassured her there are resources.

## 2019-05-03 ENCOUNTER — Encounter: Payer: Self-pay | Admitting: Hematology

## 2019-05-04 ENCOUNTER — Telehealth: Payer: Self-pay | Admitting: Hematology

## 2019-05-04 NOTE — Telephone Encounter (Signed)
Scheduled appt per 2/24 los.  Spoke with pt and she is aware of her phone visit appt date and time.

## 2019-05-08 ENCOUNTER — Telehealth (HOSPITAL_COMMUNITY): Payer: Self-pay

## 2019-05-09 ENCOUNTER — Telehealth: Payer: Self-pay

## 2019-05-09 ENCOUNTER — Encounter (HOSPITAL_COMMUNITY): Payer: Self-pay

## 2019-05-09 NOTE — Telephone Encounter (Signed)
Received VM from staff member at [facility] stating they have not been able to get in contact with patient to get her biopsy scheduled that Dr. Burr Medico ordered. Called patient number and left a voicemail with scheduling departments direct number [phone]) for patient to call back and have appointment scheduled. Encouraged patient to call office number back if she had and questions/concerns.

## 2019-05-09 NOTE — Progress Notes (Unsigned)
[employer] Female, 44 y.o., [birth year] MRN:  [id number] Phone:  [phone] Jerilynn Mages) PCP:  Patient, No Pcp Per Primary Cvg:  None Next Appt With Radiology ([facility]-CT 1) 05/18/2019 at 9:00 AM  RE: Biopsy Received: Yesterday Message Contents  Arne Cleveland, MD  Lenore Cordia      Ok   CT core mesenteric mass  Lymphoma v GIST etc  [facility]   Previous Messages  ----- Message -----  From: Lenore Cordia  Sent: 05/08/2019  8:45 AM EST  To: [facility] Procedure Requests  Subject: Biopsy                      Procedure Requested: CT Biopsy    Reason for Procedure: mesentary mass, biopsy for tussue dignosis rule out malignancy    Provider Requesting: Truitt Merle   Provider Telephone: [phone]    Other Info: Rad exams in Epic

## 2019-05-10 ENCOUNTER — Telehealth: Payer: Self-pay | Admitting: Hematology

## 2019-05-10 NOTE — Telephone Encounter (Signed)
Rescheduled appt to 3/18 per 3/4 sch msg. Left voicemail with appt details.

## 2019-05-16 NOTE — Progress Notes (Addendum)
[name]   [phone] [phone]   Clinic Follow up Note   Patient Care Team: Patient, No Pcp Per as PCP - General (General Practice) Jonnie Finner, RN as Oncology Nurse Navigator   I connected with Amy Frost on 05/24/2019 at  9:00 AM EDT by telephone visit and verified that I am speaking with the correct person using two identifiers.  I discussed the limitations, risks, security and privacy concerns of performing an evaluation and management service by telephone and the availability of in person appointments. I also discussed with the patient that there may be a patient responsible charge related to this service. The patient expressed understanding and agreed to proceed.   Patient's location:  Her home Provider's location:   My Office  CHIEF COMPLAINT: F/u of Mesenteric Mass   INTERVAL HISTORY:  Amy Frost is here for a follow up. They identified themselves by birth date. She notes her biopsy procedure went well. She denies any abdominal pain.    REVIEW OF SYSTEMS:   Constitutional: Denies fevers, chills or abnormal weight loss Eyes: Denies blurriness of vision Ears, nose, mouth, throat, and face: Denies mucositis or sore throat Respiratory: Denies cough, dyspnea or wheezes Cardiovascular: Denies palpitation, chest discomfort or lower extremity swelling Gastrointestinal:  Denies nausea, heartburn or change in bowel habits Skin: Denies abnormal skin rashes Lymphatics: Denies new lymphadenopathy or easy bruising Neurological:Denies numbness, tingling or new weaknesses Behavioral/Psych: Mood is stable, no new changes  All other systems were reviewed with the patient and are negative.  MEDICAL HISTORY:  History reviewed. No pertinent past medical history.  SURGICAL HISTORY: History reviewed. No pertinent surgical history.  I have reviewed the social history and family history with the patient and they are unchanged from previous  note.  ALLERGIES:  has No Known Allergies.  MEDICATIONS:  Current Outpatient Medications  Medication Sig Dispense Refill  . naproxen (NAPROSYN) 250 MG tablet Take 1 tablet (250 mg total) by mouth 2 (two) times daily with a meal. 30 tablet 0  . ondansetron (ZOFRAN ODT) 4 MG disintegrating tablet Take 1 tablet (4 mg total) by mouth every 8 (eight) hours as needed. 20 tablet 0   No current facility-administered medications for this visit.    PHYSICAL EXAMINATION: ECOG PERFORMANCE STATUS: 0 - Asymptomatic  No vitals taken today, Exam not performed today   LABORATORY DATA:  I have reviewed the data as listed CBC Latest Ref Rng & Units 05/18/2019 04/23/2019  WBC 4.0 - 10.5 K/uL 6.1 7.6  Hemoglobin 12.0 - 15.0 g/dL 11.7(L) 11.9(L)  Hematocrit 36.0 - 46.0 % 38.4 37.9  Platelets 150 - 400 K/uL 288 262     CMP Latest Ref Rng & Units 04/23/2019  Glucose 70 - 99 mg/dL 99  BUN 6 - 20 mg/dL 11  Creatinine 0.44 - 1.00 mg/dL 0.76  Sodium 135 - 145 mmol/L 139  Potassium 3.5 - 5.1 mmol/L 4.2  Chloride 98 - 111 mmol/L 103  CO2 22 - 32 mmol/L 25  Calcium 8.9 - 10.3 mg/dL 9.5  Total Protein 6.5 - 8.1 g/dL 7.8  Total Bilirubin 0.3 - 1.2 mg/dL 0.8  Alkaline Phos 38 - 126 U/L 70  AST 15 - 41 U/L 35  ALT 0 - 44 U/L 33    CT Angio Chest 04/23/19  IMPRESSION: 1. Limited study for the detection of pulmonary emboli as detailed above. Given this limitation, no acute pulmonary embolism was detected. 2. There is a 5.2 x 3 cm  hyperenhancing mass in the abdomen as detailed above. Differential considerations include but are not limited to a desmoid or carcinoid tumor. 3. Indeterminate 3 cm mass in the right hepatic lobe. Follow-up with a nonemergent contrast-enhanced MRI is recommended.    CT AP W contrast 04/23/19  IMPRESSION: 1. No acute findings identified within the abdomen or pelvis. 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 This is of uncertain etiology. Differential considerations  include desmoid tumor, carcinoid tumor, GIST, lymphoma, or metastatic adenopathy. 2. There is a low-density mass with peripheral enhancement within the dome of liver which is favored to represent a benign abnormality such as hemangioma. This could be confirmed with nonemergent contrast enhanced liver protocol MRI.   CT Biopsy 05/18/19 FINAL MICROSCOPIC DIAGNOSIS:  A. MESENTERIC MASS, MIDLINE, BIOPSY:  -Reactive lymphoid hyperplasia  -See comment    RADIOGRAPHIC STUDIES: I have personally reviewed the radiological images as listed and agreed with the findings in the report. No results found.   ASSESSMENT & PLAN:  Amy Frost is a 44 y.o. female with    1. Mesenteric Mass  -This is likely incidental finding. Her CT angio chest and CT AP from 04/23/19 showed 5.9cm mesenteric mass. It does not appear to be involving attached to her small bowel, could be a lymph node but no other adenopathy on CT  -We discussed her biopsy from 05/18/19 which showed reactive lymphoid hyperplasia. I discussed it is very unusual for a reactive LN to be this large size, indolent lymphoma is not completed ruled out due to sampling issue. Other possibility of neuroendocrine tumor, or metastatic disease is less likely but not ruled out  -I recommend lab work for tumor markers including chromogranin A, 24-hour urine 5 HIAA, CEA, CA 19.9, CA125, LDH etc. I also recommend endoscopy work up to rule out GI malignancy of colitis. If workup all normal I recommend to monitor with repeat scan in 3 months. If further growth may need another biopsy. She is agreeable with more work up.  -I will call her with results and if needed f/u with a repeat CT scan in 3 months.    2. Mild Anemia  -She notes to having heavy menses every other month.  -Labs from 04/23/19 show Hg 11.9.  -I recommend she take multivitamin with iron.    3. Financial support -She previously worked for [employer]. She is currently not working. She is  nervous about working outside of home  -She has not had medical insurance for the past 2 years and has not been seen by PCP or Gyn. She did apply for insurance in 01/2019 with [employer] but nor sure if they cover anything.  -I discussed she can speak with our financial advocate or SW about more information.  -I recommend she establish a PCP under [facility]    4. Over all health, Cancer screenings -She is overall healthy with no significant PMHx or FMHx. 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 She has not had surgery for removal as this is cosmetic surgery that her insurance does not cover -She has not had a mammogram before. She is of age and I can look into assistance or her insurance coverage for her to have a Mammogram yearly. She is interested.    PLAN:  -Lab in 1-2 weeks  -referral to [facility] for endoscopy  -f/u in 3 months with lab and CT abdomen/pel w contrast a few days before    No problem-specific Assessment & Plan notes found for this encounter.  Orders Placed This Encounter  Procedures  . CT Abdomen Pelvis W Contrast    Standing Status:   Future    Standing Expiration Date:   05/23/2020    Order Specific Question:   If indicated for the ordered procedure, I authorize the administration of contrast media per Radiology protocol    Answer:   Yes    Order Specific Question:   Is patient pregnant?    Answer:   No    Order Specific Question:   Preferred imaging location?    Answer:   [facility]    Order Specific Question:   Is Oral Contrast requested for this exam?    Answer:   Yes, Per Radiology protocol    Order Specific Question:   Radiology Contrast Protocol - do NOT remove file path    Answer:   [email or web address]  . CEA (IN HOUSE-[facility])    Standing Status:   Future    Standing Expiration Date:   05/23/2020  . Chromogranin A    Standing Status:   Future    Standing Expiration Date:   05/23/2020  . CA 125    Standing Status:    Future    Standing Expiration Date:   05/23/2020  . CA 19.9    Standing Status:   Future    Standing Expiration Date:   05/23/2020  . 24 Hr Ur 5 HIAA Qnt    Standing Status:   Future    Standing Expiration Date:   05/23/2020  . CBC with Differential (Cancer Center Only)    Standing Status:   Future    Standing Expiration Date:   05/23/2020  . CMP ([place] only)    Standing Status:   Future    Standing Expiration Date:   05/23/2020  . Lactate dehydrogenase (LDH)    Standing Status:   Future    Standing Expiration Date:   05/23/2020  . Ambulatory referral to Gastroenterology    Referral Priority:   Routine    Referral Type:   Consultation    Referral Reason:   Specialty Services Required    Number of Visits Requested:   1   I discussed the assessment and treatment plan with the patient. The patient was provided an opportunity to ask questions and all were answered. The patient agreed with the plan and demonstrated an understanding of the instructions.  The patient was advised to call back or seek an in-person evaluation if the symptoms worsen or if the condition fails to improve as anticipated.  The total time spent in the appointment was 25 minutes.    Truitt Merle, MD 05/24/2019   I, Joslyn Devon, am acting as scribe for Truitt Merle, MD.   I have reviewed the above documentation for accuracy and completeness, and I agree with the above.

## 2019-05-17 ENCOUNTER — Ambulatory Visit: Payer: Self-pay | Admitting: Hematology

## 2019-05-17 ENCOUNTER — Other Ambulatory Visit: Payer: Self-pay | Admitting: Student

## 2019-05-18 ENCOUNTER — Ambulatory Visit (HOSPITAL_COMMUNITY)
Admission: RE | Admit: 2019-05-18 | Discharge: 2019-05-18 | Disposition: A | Discharge status: Home / Self Care | Payer: 59 | Source: Ambulatory Visit | Attending: Hematology | Admitting: Hematology

## 2019-05-18 ENCOUNTER — Other Ambulatory Visit: Payer: Self-pay

## 2019-05-18 ENCOUNTER — Encounter (HOSPITAL_COMMUNITY): Payer: Self-pay

## 2019-05-18 DIAGNOSIS — Z79899 Other long term (current) drug therapy: Secondary | ICD-10-CM | POA: Diagnosis not present

## 2019-05-18 DIAGNOSIS — Z791 Long term (current) use of non-steroidal anti-inflammatories (NSAID): Secondary | ICD-10-CM | POA: Insufficient documentation

## 2019-05-18 DIAGNOSIS — R599 Enlarged lymph nodes, unspecified: Secondary | ICD-10-CM | POA: Diagnosis not present

## 2019-05-18 DIAGNOSIS — K6389 Other specified diseases of intestine: Secondary | ICD-10-CM

## 2019-05-18 LAB — CBC
HCT: 38.4 % (ref 36.0–46.0)
Hemoglobin: 11.7 g/dL — ABNORMAL LOW (ref 12.0–15.0)
MCH: 25.8 pg — ABNORMAL LOW (ref 26.0–34.0)
MCHC: 30.5 g/dL (ref 30.0–36.0)
MCV: 84.8 fL (ref 80.0–100.0)
Platelets: 288 K/uL (ref 150–400)
RBC: 4.53 MIL/uL (ref 3.87–5.11)
RDW: 15 % (ref 11.5–15.5)
WBC: 6.1 K/uL (ref 4.0–10.5)
nRBC: 0 % (ref 0.0–0.2)

## 2019-05-18 LAB — PROTIME-INR
INR: 1 (ref 0.8–1.2)
Prothrombin Time: 13.1 s (ref 11.4–15.2)

## 2019-05-18 MED ORDER — SODIUM CHLORIDE 0.9 % IV SOLN: INTRAVENOUS | Status: DC

## 2019-05-18 MED ORDER — FLUMAZENIL 0.5 MG/5ML IV SOLN
INTRAVENOUS | Status: AC
  Filled 2019-05-18: qty 5

## 2019-05-18 MED ORDER — FENTANYL CITRATE (PF) 100 MCG/2ML IJ SOLN
INTRAMUSCULAR | Status: AC
  Filled 2019-05-18: qty 4

## 2019-05-18 MED ORDER — MIDAZOLAM HCL 2 MG/2ML IJ SOLN
INTRAMUSCULAR | Status: AC | PRN
  Administered 2019-05-18 (×4): 1 mg via INTRAVENOUS

## 2019-05-18 MED ORDER — MIDAZOLAM HCL 2 MG/2ML IJ SOLN
INTRAMUSCULAR | Status: AC
  Filled 2019-05-18: qty 4

## 2019-05-18 MED ORDER — NALOXONE HCL 0.4 MG/ML IJ SOLN
INTRAMUSCULAR | Status: AC
  Filled 2019-05-18: qty 1

## 2019-05-18 MED ORDER — LIDOCAINE HCL (PF) 1 % IJ SOLN
INTRAMUSCULAR | Status: AC | PRN
  Administered 2019-05-18: 10 mL via INTRADERMAL

## 2019-05-18 MED ORDER — FENTANYL CITRATE (PF) 100 MCG/2ML IJ SOLN
INTRAMUSCULAR | Status: AC | PRN
  Administered 2019-05-18 (×2): 50 ug via INTRAVENOUS

## 2019-05-18 NOTE — Procedures (Signed)
Interventional Radiology Procedure Note  Procedure: CT Guided Biopsy of mesenteric mass  Complications: None  Estimated Blood Loss: < 10 mL  Findings: 78 G core biopsy of mesenteric abdominal mass performed under CT guidance.  Two core samples obtained and sent to Pathology. Gelfoam pledgets advanced on completion.  Venetia Night. Kathlene Cote, M.D Pager:  [phone]

## 2019-05-18 NOTE — Consult Note (Addendum)
Chief Complaint: Patient was seen in consultation today for image guided biopsy of mesenteric mass  Referring Physician(s): Feng,Yan  Supervising Physician: Aletta Edouard  Patient Status: [facility] - Out-pt  History of Present Illness: Amy Frost is a 44 y.o. 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  She presents today for image guided biopsy of the mass for further evaluation.  History reviewed. No pertinent past medical history.  History reviewed. No pertinent surgical history.  Allergies: Patient has no known allergies.  Medications: Prior to Admission medications   Medication Sig Start Date End Date Taking? Authorizing Provider  naproxen (NAPROSYN) 250 MG tablet Take 1 tablet (250 mg total) by mouth 2 (two) times daily with a meal. 04/19/15   Waynetta Pean, PA-C  ondansetron (ZOFRAN ODT) 4 MG disintegrating tablet Take 1 tablet (4 mg total) by mouth every 8 (eight) hours as needed. 04/23/19   Horton, Barbette Hair, MD     History reviewed. No pertinent family history.  Social History   Socioeconomic History  . Marital status: Married    Spouse name: Not on file  . Number of children: 7  . Years of education: Not on file  . Highest education level: Not on file  Occupational History  . Not on file  Tobacco Use  . Smoking status: Never Smoker  . Smokeless tobacco: Never Used  Substance and Sexual Activity  . Alcohol use: No  . Drug use: No  . Sexual activity: Not on file  Other Topics Concern  . Not on file  Social History Narrative  . Not on file   Social Determinants of Health   Financial Resource Strain:   . Difficulty of Paying Living Expenses:   Food Insecurity:   . Worried About Charity fundraiser in the Last Year:   . Arboriculturist in the Last Year:     Transportation Needs:   . Film/video editor (Medical):   Marland Kitchen Lack of Transportation (Non-Medical):   Physical Activity:   . Days of Exercise per Week:   . Minutes of Exercise per Session:   Stress:   . Feeling of Stress :   Social Connections:   . Frequency of Communication with Friends and Family:   . Frequency of Social Gatherings with Friends and Family:   . Attends Religious Services:   . Active Member of Clubs or Organizations:   . Attends Archivist Meetings:   Marland Kitchen Marital Status:       Review of Systems currently denies fever, headache, chest pain, dyspnea, cough, abdominal/back pain, nausea, vomiting or bleeding  Vital Signs: BP 129/82   Pulse 71   Temp 98 F (36.7 C) (Oral)   Resp 16   Ht 5\' 5"  (1.651 m)   Wt 214 lb (97.1 kg)   LMP 05/04/2019 (Approximate)   SpO2 99%   BMI 35.61 kg/m   Physical Exam awake, alert.  Chest clear to auscultation bilaterally.  Heart with regular rate and rhythm.  Abdomen soft, positive bowel sounds, nontender.  No lower extremity edema.  Imaging: CT Angio Chest PE W and/or Wo Contrast  Result Date: 04/23/2019 CLINICAL DATA:  Shortness of breath EXAM: CT ANGIOGRAPHY CHEST WITH CONTRAST TECHNIQUE: Multidetector CT imaging of the chest was performed using the standard protocol during bolus administration of intravenous  contrast. Multiplanar CT image reconstructions and MIPs were obtained to evaluate the vascular anatomy. CONTRAST:  59mL OMNIPAQUE IOHEXOL 350 MG/ML SOLN COMPARISON:  None. FINDINGS: Cardiovascular: Evaluation for pulmonary emboli was limited by respiratory motion artifact.Given this limitation, no definite acute pulmonary embolism was detected. The main pulmonary artery is within normal limits for size. There is no CT evidence of acute right heart strain. The visualized aorta is normal. Heart size is normal, without pericardial effusion. Mediastinum/Nodes: --No mediastinal or hilar lymphadenopathy. --No axillary  lymphadenopathy. --No supraclavicular lymphadenopathy. --Normal thyroid gland. --The esophagus is unremarkable Lungs/Pleura: No pulmonary nodules or masses. No pleural effusion or pneumothorax. No focal airspace consolidation. No focal pleural abnormality. Upper Abdomen: There is a hypoattenuating 2.9 by 2.7 cm lesion in hepatic segment 8/7 (axial series 6, image 57). There is suggestion of peripheral nodular hyperenhancement surrounding this lesion and is therefore favored to represent a benign hemangioma.There is a hyperenhancing 5.2 by 3 cm mass in the anterior abdomen, anterior to the SMA, in the root of the mesentery. There are multiple enlarged draining veins that appear to connect to the portal venous confluence. Musculoskeletal: No chest wall abnormality. No acute or significant osseous findings. Review of the MIP images confirms the above findings. IMPRESSION: 1. Limited study for the detection of pulmonary emboli as detailed above. Given this limitation, no acute pulmonary embolism was detected. 2. There is a 5.2 x 3 cm hyperenhancing mass in the abdomen as detailed above. Differential considerations include but are not limited to a desmoid or carcinoid tumor. 3. Indeterminate 3 cm mass in the right hepatic lobe. Follow-up with a nonemergent contrast-enhanced MRI is recommended. Electronically Signed   By: Constance Holster M.D.   On: 04/23/2019 03:19   CT ABDOMEN PELVIS W CONTRAST  Result Date: 04/23/2019 CLINICAL DATA:  Vomiting. Short of breath. Anterior chest pain. Abdominal mass EXAM: CT ABDOMEN AND PELVIS WITH CONTRAST TECHNIQUE: Multidetector CT imaging of the abdomen and pelvis was performed using the standard protocol following bolus administration of intravenous contrast. CONTRAST:  13mL OMNIPAQUE IOHEXOL 300 MG/ML  SOLN COMPARISON:  None. FINDINGS: Lower chest: No acute abnormality. Hepatobiliary: Within the lateral dome of liver there is a low-density lesion with peripheral and nodular  enhancement which measures 3.0 x 3.0 cm, image 7/2. The focal fatty deposition noted adjacent to the falciform ligament. Vicarious excretion of contrast noted within the gallbladder. No gallbladder wall inflammation or biliary ductal dilatation. Pancreas: Unremarkable. No pancreatic ductal dilatation or surrounding inflammatory changes. Spleen: Normal in size without focal abnormality. Adrenals/Urinary Tract: Normal adrenal glands. The kidneys are unremarkable. No mass or hydronephrosis identified bilaterally. Small low-density structure within posterior cortex of left kidney is too small to characterize measuring 7 mm, image 19/7. The urinary bladder is unremarkable. Stomach/Bowel: Stomach is normal. The small bowel loops are unremarkable. The appendix is visualized and appears normal. No wall thickening, inflammation or abnormal distension. Vascular/Lymphatic: Abdominal aorta is unremarkable. Soft tissue mass within the central mesentery is again identified. This measures 5.4 x 2.8 by 5.9 cm (volume = 47 cm^3). Reproductive: Uterus and bilateral adnexa are unremarkable. Other: There is no ascites or focal fluid collection identified within the abdomen or pelvis. Musculoskeletal: No acute or significant osseous findings. IMPRESSION: 1. No acute findings identified within the abdomen or pelvis. 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 This is of uncertain etiology. Differential considerations include desmoid tumor, carcinoid tumor, GIST, lymphoma, or metastatic adenopathy. 2. There is a low-density mass with peripheral enhancement within the dome of liver  which is favored to represent a benign abnormality such as hemangioma. This could be confirmed with nonemergent contrast enhanced liver protocol MRI. Electronically Signed   By: Kerby Moors M.D.   On: 04/23/2019 11:42   DG Chest Portable 1 View  Result Date: 04/23/2019 CLINICAL DATA:  Chest pain. Chills and shortness of breath. Vomiting. EXAM: PORTABLE CHEST 1  VIEW COMPARISON:  None. FINDINGS: The cardiomediastinal contours are normal. The lungs are clear. Pulmonary vasculature is normal. No consolidation, pleural effusion, or pneumothorax. No acute osseous abnormalities are seen. IMPRESSION: Negative AP view of the chest. Electronically Signed   By: Keith Rake M.D.   On: 04/23/2019 01:57    Labs:  CBC: Recent Labs    04/23/19 0139  WBC 7.6  HGB 11.9*  HCT 37.9  PLT 262    COAGS: No results for input(s): INR, APTT in the last 8760 hours.  BMP: Recent Labs    04/23/19 0139  NA 139  K 4.2  CL 103  CO2 25  GLUCOSE 99  BUN 11  CALCIUM 9.5  CREATININE 0.76  GFRNONAA >60  GFRAA >60    LIVER FUNCTION TESTS: Recent Labs    04/23/19 0139  BILITOT 0.8  AST 35  ALT 33  ALKPHOS 70  PROT 7.8  ALBUMIN 4.5    TUMOR MARKERS: No results for input(s): AFPTM, CEA, CA199, CHROMGRNA in the last 8760 hours.  Assessment and Plan: 44 y.o. 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  She presents today for image guided biopsy of the mass for further evaluation.Risks and benefits of procedure was discussed with the patient including, but not limited to bleeding, infection, damage to adjacent structures or low yield requiring additional tests or inability to perform biopsy.  All of the questions were answered and there is agreement to proceed.  Consent signed and in chart.     Thank you for this interesting consult.  I greatly enjoyed meeting [employer] and look forward to participating in their care.  A copy of this report was sent to the requesting provider on this date.  Electronically Signed: D. Rowe Robert, PA-C 05/18/2019, 8:21 AM   I spent a total of 25 minutes in face to face in clinical consultation, greater than 50% of which was  counseling/coordinating care for image guided mesenteric mass biopsy

## 2019-05-18 NOTE — Discharge Instructions (Addendum)
Biopsy Discharge Instructions  The procedure you just had is called a biopsy.  You may feel some discomfort after the local anesthetic wears off.  Your discomfort should improve over the next several days.  AFTER YOUR BIOPSY  Rest for the remainder of the day.  Avoid heavy lifting (more than 10 lb/4.5 kg).  If you have been given a general anesthetic or other medications to help you relax, you should not operate machinery, drive or make legal decisions for 24 hours after your procedure.  Additionally, someone must be available to drive you home.  Only take over-the-counter or prescription medicines for pain, discomfort, or fever as directed by your caregiver.  This can make bleeding worse.  You may resume your usual diet after the procedure.  Avoid alcoholic beverages for 24 hours after your procedure.  Keep the skin around your biopsy site clean and dry.  You may shower after 24 hours.  Cleanse and dry the biopsy site completely after you shower.  Avoid baths and swimming for 72 hours.  Complications are very uncommon after this procedure.  Go to the nearest Emergency Department or contact your caregiver if you develop any of the following symptoms:  Worsening pain  Bleeding  Swelling at the biopsy site  Light headedness or dizziness  Shortness of Breath  Fever or chills Redness or increased pain or swelling at the biopsy site   Moderate Conscious Sedation, Adult, Care After These instructions provide you with information about caring for yourself after your procedure. Your health care provider may also give you more specific instructions. Your treatment has been planned according to current medical practices, but problems sometimes occur. Call your health care provider if you have any problems or questions after your procedure. What can I expect after the procedure? After your procedure, it is common:  To feel sleepy for several hours.  To feel clumsy and have poor balance  for several hours.  To have poor judgment for several hours.  To vomit if you eat too soon. Follow these instructions at home: For at least 24 hours after the procedure:   Do not: ? Participate in activities where you could fall or become injured. ? Drive. ? Use heavy machinery. ? Drink alcohol. ? Take sleeping pills or medicines that cause drowsiness. ? Make important decisions or sign legal documents. ? Take care of children on your own.  Rest. Eating and drinking  Follow the diet recommended by your health care provider.  If you vomit: ? Drink water, juice, or soup when you can drink without vomiting. ? Make sure you have little or no nausea before eating solid foods. General instructions  Have a responsible adult stay with you until you are awake and alert.  Take over-the-counter and prescription medicines only as told by your health care provider.  If you smoke, do not smoke without supervision.  Keep all follow-up visits as told by your health care provider. This is important. Contact a health care provider if:  You keep feeling nauseous or you keep vomiting.  You feel light-headed.  You develop a rash.  You have a fever. Get help right away if:  You have trouble breathing. This information is not intended to replace advice given to you by your health care provider. Make sure you discuss any questions you have with your health care provider. Document Revised: 02/04/2017 Document Reviewed: 06/14/2015 Elsevier Patient Education  2020 Elsevier Inc.  

## 2019-05-22 LAB — SURGICAL PATHOLOGY

## 2019-05-24 ENCOUNTER — Inpatient Hospital Stay: Payer: 59 | Attending: Hematology | Admitting: Hematology

## 2019-05-24 ENCOUNTER — Encounter: Payer: Self-pay | Admitting: Hematology

## 2019-05-24 DIAGNOSIS — R1900 Intra-abdominal and pelvic swelling, mass and lump, unspecified site: Secondary | ICD-10-CM | POA: Diagnosis not present

## 2019-05-24 DIAGNOSIS — D171 Benign lipomatous neoplasm of skin and subcutaneous tissue of trunk: Secondary | ICD-10-CM | POA: Insufficient documentation

## 2019-05-24 DIAGNOSIS — K6389 Other specified diseases of intestine: Secondary | ICD-10-CM | POA: Diagnosis not present

## 2019-05-24 DIAGNOSIS — N920 Excessive and frequent menstruation with regular cycle: Secondary | ICD-10-CM | POA: Insufficient documentation

## 2019-05-24 DIAGNOSIS — Z79899 Other long term (current) drug therapy: Secondary | ICD-10-CM | POA: Insufficient documentation

## 2019-05-24 DIAGNOSIS — R599 Enlarged lymph nodes, unspecified: Secondary | ICD-10-CM | POA: Insufficient documentation

## 2019-05-24 DIAGNOSIS — D649 Anemia, unspecified: Secondary | ICD-10-CM | POA: Insufficient documentation

## 2019-05-24 DIAGNOSIS — R1909 Other intra-abdominal and pelvic swelling, mass and lump: Secondary | ICD-10-CM | POA: Insufficient documentation

## 2019-05-24 NOTE — Addendum Note (Signed)
Addended by: Truitt Merle on: 05/24/2019 10:29 AM   Modules accepted: Orders

## 2019-05-25 ENCOUNTER — Telehealth: Payer: Self-pay | Admitting: Hematology

## 2019-05-25 NOTE — Telephone Encounter (Signed)
Scheduled appt per 3/18 los.  Spoke with pt and she is aware of the appt date and time.

## 2019-05-30 ENCOUNTER — Inpatient Hospital Stay: Payer: 59

## 2019-05-30 ENCOUNTER — Other Ambulatory Visit: Payer: Self-pay

## 2019-05-30 DIAGNOSIS — N920 Excessive and frequent menstruation with regular cycle: Secondary | ICD-10-CM | POA: Diagnosis not present

## 2019-05-30 DIAGNOSIS — D171 Benign lipomatous neoplasm of skin and subcutaneous tissue of trunk: Secondary | ICD-10-CM | POA: Diagnosis not present

## 2019-05-30 DIAGNOSIS — R599 Enlarged lymph nodes, unspecified: Secondary | ICD-10-CM | POA: Diagnosis not present

## 2019-05-30 DIAGNOSIS — K6389 Other specified diseases of intestine: Secondary | ICD-10-CM

## 2019-05-30 DIAGNOSIS — Z79899 Other long term (current) drug therapy: Secondary | ICD-10-CM | POA: Diagnosis not present

## 2019-05-30 DIAGNOSIS — D649 Anemia, unspecified: Secondary | ICD-10-CM | POA: Diagnosis not present

## 2019-05-30 DIAGNOSIS — R1909 Other intra-abdominal and pelvic swelling, mass and lump: Secondary | ICD-10-CM | POA: Diagnosis present

## 2019-05-30 LAB — CEA (IN HOUSE-CHCC): CEA (CHCC-In House): <1 ng/mL (ref 0.00–5.00)

## 2019-05-30 LAB — LACTATE DEHYDROGENASE: LDH: 207 U/L — ABNORMAL HIGH (ref 98–192)

## 2019-05-31 LAB — CHROMOGRANIN A: Chromogranin A (ng/mL): 46.7 ng/mL (ref 0.0–101.8)

## 2019-05-31 LAB — CA 125: Cancer Antigen (CA) 125: 7.7 U/mL (ref 0.0–38.1)

## 2019-05-31 LAB — CANCER ANTIGEN 19-9: CA 19-9: 4 U/mL (ref 0–35)

## 2019-06-01 ENCOUNTER — Ambulatory Visit (INDEPENDENT_AMBULATORY_CARE_PROVIDER_SITE_OTHER): Payer: 59 | Admitting: Gastroenterology

## 2019-06-01 ENCOUNTER — Encounter: Payer: Self-pay | Admitting: Gastroenterology

## 2019-06-01 VITALS — BP 120/70 | HR 59 | Temp 98.4°F | Ht 65.5 in | Wt 214.0 lb

## 2019-06-01 DIAGNOSIS — R6881 Early satiety: Secondary | ICD-10-CM | POA: Diagnosis not present

## 2019-06-01 DIAGNOSIS — R935 Abnormal findings on diagnostic imaging of other abdominal regions, including retroperitoneum: Secondary | ICD-10-CM

## 2019-06-01 DIAGNOSIS — K6389 Other specified diseases of intestine: Secondary | ICD-10-CM

## 2019-06-01 DIAGNOSIS — K219 Gastro-esophageal reflux disease without esophagitis: Secondary | ICD-10-CM

## 2019-06-01 NOTE — Patient Instructions (Signed)
If you are age 44 or older, your body mass index should be between 23-30. Your Body mass index is 35.07 kg/m. If this is out of the aforementioned range listed, please consider follow up with your Primary Care Provider.  If you are age 98 or younger, your body mass index should be between 19-25. Your Body mass index is 35.07 kg/m. If this is out of the aformentioned range listed, please consider follow up with your Primary Care Provider.   Due to recent changes in healthcare laws, you may see the results of your imaging and laboratory studies on MyChart before your provider has had a chance to review them.  We understand that in some cases there may be results that are confusing or concerning to you. Not all laboratory results come back in the same time frame and the provider may be waiting for multiple results in order to interpret others.  Please give [place] 48 hours in order for your provider to thoroughly review all the results before contacting the office for clarification of your results.   You have been scheduled for an endoscopy. Please follow written instructions given to you at your visit today. If you use inhalers (even only as needed), please bring them with you on the day of your procedure.  Thank you for choosing me and [facility].  Dr. Rush Landmark

## 2019-06-02 ENCOUNTER — Encounter: Payer: Self-pay | Admitting: Gastroenterology

## 2019-06-02 DIAGNOSIS — R6881 Early satiety: Secondary | ICD-10-CM | POA: Insufficient documentation

## 2019-06-02 DIAGNOSIS — R935 Abnormal findings on diagnostic imaging of other abdominal regions, including retroperitoneum: Secondary | ICD-10-CM | POA: Insufficient documentation

## 2019-06-02 NOTE — Progress Notes (Signed)
[place] VISIT   Primary Care Provider Patient, No Pcp Per No address on file None  Referring Provider Truitt Merle, MD [address] [place],  [place] [address] [phone]  Patient Profile: Amy Frost is a 44 y.o. 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  The patient presents to the [facility] for an evaluation and management of problem(s) noted below:  Problem List 1. Mesenteric mass   2. Abnormal CT of the abdomen   3. Early satiety   4. Gastroesophageal reflux disease, unspecified whether esophagitis present     History of Present Illness This is the patient's first visit to the outpatient [place] clinic.  She is referred by oncology for further endoscopic evaluation of the recently found mesenteric mass.  The patient has suffered from acid reflux/pyrosis for years.  However, this mostly occurs at nighttime and from dietary indiscretions.  If things are really bad she will take Rolaids but she has not used H2 RA or PPI therapy in the past.  She has normal bowel movements on a daily basis without any blood in the stool.  As she recounts before she went to the emergency department where she was found to have this mesenteric mass the patient developed severe chills with trembling and subsequent development of nausea and vomiting and abdominal discomfort.  For these reasons she presented to the ED.  The CT scan that was performed at that time is noted below.  The patient had complete cessation of her symptoms after being in the emergency department.  She has not had any recurrence.  This eventually led to a oncology evaluation and subsequent biopsy via interventional radiology.  The biopsy results are significant for lymphoid hyperplasia but no evidence of malignancy.  The patient has suffered from few episodes of dysphagia like symptoms from rice but no other issues from any  other meals.  She does experience early satiety and has experienced this for years.  However the patient had lost weight intentionally over the course of the last 2 years however during Covid things stabilized.  She is not losing weight unintentionally at any time point.  She is currently at home taking care of her children.  The patient has never had an upper or lower endoscopy.  There are no GI malignancies in the family.  GI Review of Systems Positive as above Negative for current dysphagia, odynophagia, nausea, vomiting, pain, change in bowel habits  Review of Systems General: Denies fevers/chills HEENT: Denies oral lesions Cardiovascular: Denies chest pain Pulmonary: Denies shortness of breath Gastroenterological: See HPI Genitourinary: Denies darkened urine Hematological: Denies easy bruising/bleeding Endocrine: Denies temperature intolerance Dermatological: Denies jaundice Psychological: Mood is anxious to know what is the etiology of the patient's mass   Medications No current outpatient medications on file.   No current facility-administered medications for this visit.    Allergies No Known Allergies  Histories Past Medical History:  Diagnosis Date  . Mesenteric mass    Past Surgical History:  Procedure Laterality Date  . NO PAST SURGERIES     Social History   Socioeconomic History  . Marital status: Married    Spouse name: Not on file  . Number of children: 7  . Years of education: Not on file  . Highest education level: Not on file  Occupational History  . Occupation: self employed  Tobacco Use  . Smoking status: Never Smoker  . Smokeless tobacco: Never Used  Substance and Sexual Activity  . Alcohol use: No  . Drug use: No  . Sexual activity: Not on file  Other Topics Concern  . Not on file  Social History Narrative  . Not on file   Social Determinants of Health   Financial Resource Strain:   . Difficulty of Paying Living Expenses:   Food  Insecurity:   . Worried About Charity fundraiser in the Last Year:   . Arboriculturist in the Last Year:   Transportation Needs:   . Film/video editor (Medical):   Marland Kitchen Lack of Transportation (Non-Medical):   Physical Activity:   . Days of Exercise per Week:   . Minutes of Exercise per Session:   Stress:   . Feeling of Stress :   Social Connections:   . Frequency of Communication with Friends and Family:   . Frequency of Social Gatherings with Friends and Family:   . Attends Religious Services:   . Active Member of Clubs or Organizations:   . Attends Archivist Meetings:   Marland Kitchen Marital Status:   Intimate Partner Violence:   . Fear of Current or Ex-Partner:   . Emotionally Abused:   Marland Kitchen Physically Abused:   . Sexually Abused:    Family History  Problem Relation Age of Onset  . Hypertension Mother   . Colon cancer Neg Hx   . Esophageal cancer Neg Hx   . Rectal cancer Neg Hx   . Inflammatory bowel disease Neg Hx   . Liver disease Neg Hx   . Pancreatic cancer Neg Hx   . Stomach cancer Neg Hx    I have reviewed her medical, social, and family history in detail and updated the electronic medical record as necessary.    PHYSICAL EXAMINATION  BP 120/70   Pulse (!) 59   Temp 98.4 F (36.9 C)   Ht 5' 5.5" (1.664 m)   Wt 214 lb (97.1 kg)   LMP 05/04/2019 (Approximate)   BMI 35.07 kg/m  Wt Readings from Last 3 Encounters:  06/01/19 214 lb (97.1 kg)  05/18/19 214 lb (97.1 kg)  05/02/19 214 lb 6.4 oz (97.3 kg)  GEN: NAD, appears stated age, doesn't appear chronically ill PSYCH: Cooperative, without pressured speech EYE: Conjunctivae pink, sclerae anicteric ENT: MMM, without oral ulcers, no erythema or exudates noted CV: RR without R/Gs  RESP: CTAB posteriorly, without wheezing GI: NABS, soft, NT/ND, without rebound or guarding, no HSM appreciated MSK/EXT: No lower extremity edema SKIN: No jaundice NEURO:  Alert & Oriented x 3, no focal deficits   REVIEW OF  DATA  I reviewed the following data at the time of this encounter:  GI Procedures and Studies  Relevant studies to review  Laboratory Studies  3/12 IR guided mass biopsy results/pathology FINAL MICROSCOPIC DIAGNOSIS:   A. MESENTERIC MASS, MIDLINE, BIOPSY:  -Reactive lymphoid hyperplasia  -See comment  COMMENT:  The sections show needle core biopsy fragments of lymphoid tissue  displaying numerous variably sized lymphoid follicles with reactive  appearing germinal centers. The interfollicular zones are primarily  composed of small round to slightly irregular lymphocytes without overt  atypia. No granulomata or metastatic malignancy is identified. Flow  cytometric analysis was performed [facility] [phone]) and failed to show any  significant Tor B-cell phenotypic abnormalities. Immunohistochemical  stains for BCL2, CD3, CD5, CD20, CD79a, CD10, cyclin D1, cytokeratin  AE1/AE3, and cytokeratin 8/18 were also performed with appropriate  controls. The stains show a mixture of T  and B cells in the respective  compartments with predominance of T-cells. The germinal centers are  CD10 positive and BCL-2 negative. There is no significant cyclin D1 or  cytokeratin positivity identified. The overall appearance is  nonspecific but consistent with reactive lymphoid hyperplasia. In the  presence of a large mass, the changes may not be representative of the  disease process and hence, clinical correlation is recommended.   Imaging Studies  February 2021 CT abdomen pelvis with contrast IMPRESSION: 1. No acute findings identified within the abdomen or pelvis. 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 This is of uncertain etiology. Differential considerations include desmoid tumor, carcinoid tumor, GIST, lymphoma, or metastatic adenopathy. 2. There is a low-density mass with peripheral enhancement within the dome of liver which is favored to represent a benign abnormality such as hemangioma. This  could be confirmed with nonemergent contrast enhanced liver protocol MRI.   ASSESSMENT  Ms. Stepanski is a 44 y.o. 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  The patient is seen today for evaluation and management of:  1. Mesenteric mass   2. Abnormal CT of the abdomen   3. Early satiety   4. Gastroesophageal reflux disease, unspecified whether esophagitis present    The etiology of the patient's mesenteric mass remains unclear.  She does have some symptoms of longer standing GERD but has not been on adequate acid suppression medication and has tried to control her symptoms with dietary and lifestyle modifications.  Evaluating the lesion on the CT scan myself, I do think there is a chance for obtaining an EUS guided biopsy of the lesion as well as performing diagnostic endoscopy.  The risks of EUS including bleeding, infection, aspiration pneumonia and intestinal perforation were discussed as was the possibility it may not give a definitive diagnosis.  I do not anticipate that this lesion is actually arising from the pancreas but, if a biopsy of the pancreas is done as part of the EUS, there is an additional risk of pancreatitis at the rate of about 1%.  It was explained that procedure related pancreatitis is typically mild, although can be severe and even life threatening, which is why we do not perform random pancreatic biopsies and only biopsy a lesion we feel is concerning enough to warrant the risk.  The risks and benefits of endoscopic evaluation were discussed with the patient; these include but are not limited to the risk of perforation, infection, bleeding, missed lesions, lack of diagnosis, severe illness requiring hospitalization, as well as anesthesia and sedation related illnesses.  The patient is agreeable to proceed.  All patient questions were answered, to the best of my ability, and the patient agrees to the aforementioned plan of action  with follow-up as indicated.   PLAN  Proceed with scheduling EGD/EUS with attempted FNB Consider H2 RA such as Pepcid 1-2 times daily or nightly Consider colonoscopy in future if negative work-up from above   Orders Placed This Encounter  Procedures  . Procedural/ Surgical Case Request: UPPER ESOPHAGEAL ENDOSCOPIC ULTRASOUND (EUS)  . Ambulatory referral to Gastroenterology    New Prescriptions   No medications on file   Modified Medications   No medications on file    Planned Follow Up No follow-ups on file.   Total Time in Face-to-Face and in Coordination of Care for patient including independent/personal interpretation/review of prior testing, medical history, examination, medication adjustment, communicating results with the patient directly, and documentation with the EHR is 45  minutes.   Justice Britain, MD [facility] Advanced Endoscopy Office # [phone]

## 2019-06-02 NOTE — H&P (View-Only) (Signed)
[place] VISIT   Primary Care Provider Patient, No Pcp Per No address on file None  Referring Provider Truitt Merle, MD [address] [place],  [place] [address] [phone]  Patient Profile: Marnie Plott is a 44 y.o. 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  The patient presents to the [facility] for an evaluation and management of problem(s) noted below:  Problem List 1. Mesenteric mass   2. Abnormal CT of the abdomen   3. Early satiety   4. Gastroesophageal reflux disease, unspecified whether esophagitis present     History of Present Illness This is the patient's first visit to the outpatient [place] clinic.  She is referred by oncology for further endoscopic evaluation of the recently found mesenteric mass.  The patient has suffered from acid reflux/pyrosis for years.  However, this mostly occurs at nighttime and from dietary indiscretions.  If things are really bad she will take Rolaids but she has not used H2 RA or PPI therapy in the past.  She has normal bowel movements on a daily basis without any blood in the stool.  As she recounts before she went to the emergency department where she was found to have this mesenteric mass the patient developed severe chills with trembling and subsequent development of nausea and vomiting and abdominal discomfort.  For these reasons she presented to the ED.  The CT scan that was performed at that time is noted below.  The patient had complete cessation of her symptoms after being in the emergency department.  She has not had any recurrence.  This eventually led to a oncology evaluation and subsequent biopsy via interventional radiology.  The biopsy results are significant for lymphoid hyperplasia but no evidence of malignancy.  The patient has suffered from few episodes of dysphagia like symptoms from rice but no other issues from any  other meals.  She does experience early satiety and has experienced this for years.  However the patient had lost weight intentionally over the course of the last 2 years however during Covid things stabilized.  She is not losing weight unintentionally at any time point.  She is currently at home taking care of her children.  The patient has never had an upper or lower endoscopy.  There are no GI malignancies in the family.  GI Review of Systems Positive as above Negative for current dysphagia, odynophagia, nausea, vomiting, pain, change in bowel habits  Review of Systems General: Denies fevers/chills HEENT: Denies oral lesions Cardiovascular: Denies chest pain Pulmonary: Denies shortness of breath Gastroenterological: See HPI Genitourinary: Denies darkened urine Hematological: Denies easy bruising/bleeding Endocrine: Denies temperature intolerance Dermatological: Denies jaundice Psychological: Mood is anxious to know what is the etiology of the patient's mass   Medications No current outpatient medications on file.   No current facility-administered medications for this visit.    Allergies No Known Allergies  Histories Past Medical History:  Diagnosis Date  . Mesenteric mass    Past Surgical History:  Procedure Laterality Date  . NO PAST SURGERIES     Social History   Socioeconomic History  . Marital status: Married    Spouse name: Not on file  . Number of children: 7  . Years of education: Not on file  . Highest education level: Not on file  Occupational History  . Occupation: self employed  Tobacco Use  . Smoking status: Never Smoker  . Smokeless tobacco: Never Used  Substance and Sexual Activity  . Alcohol use: No  . Drug use: No  . Sexual activity: Not on file  Other Topics Concern  . Not on file  Social History Narrative  . Not on file   Social Determinants of Health   Financial Resource Strain:   . Difficulty of Paying Living Expenses:   Food  Insecurity:   . Worried About Charity fundraiser in the Last Year:   . Arboriculturist in the Last Year:   Transportation Needs:   . Film/video editor (Medical):   Marland Kitchen Lack of Transportation (Non-Medical):   Physical Activity:   . Days of Exercise per Week:   . Minutes of Exercise per Session:   Stress:   . Feeling of Stress :   Social Connections:   . Frequency of Communication with Friends and Family:   . Frequency of Social Gatherings with Friends and Family:   . Attends Religious Services:   . Active Member of Clubs or Organizations:   . Attends Archivist Meetings:   Marland Kitchen Marital Status:   Intimate Partner Violence:   . Fear of Current or Ex-Partner:   . Emotionally Abused:   Marland Kitchen Physically Abused:   . Sexually Abused:    Family History  Problem Relation Age of Onset  . Hypertension Mother   . Colon cancer Neg Hx   . Esophageal cancer Neg Hx   . Rectal cancer Neg Hx   . Inflammatory bowel disease Neg Hx   . Liver disease Neg Hx   . Pancreatic cancer Neg Hx   . Stomach cancer Neg Hx    I have reviewed her medical, social, and family history in detail and updated the electronic medical record as necessary.    PHYSICAL EXAMINATION  BP 120/70   Pulse (!) 59   Temp 98.4 F (36.9 C)   Ht 5' 5.5" (1.664 m)   Wt 214 lb (97.1 kg)   LMP 05/04/2019 (Approximate)   BMI 35.07 kg/m  Wt Readings from Last 3 Encounters:  06/01/19 214 lb (97.1 kg)  05/18/19 214 lb (97.1 kg)  05/02/19 214 lb 6.4 oz (97.3 kg)  GEN: NAD, appears stated age, doesn't appear chronically ill PSYCH: Cooperative, without pressured speech EYE: Conjunctivae pink, sclerae anicteric ENT: MMM, without oral ulcers, no erythema or exudates noted CV: RR without R/Gs  RESP: CTAB posteriorly, without wheezing GI: NABS, soft, NT/ND, without rebound or guarding, no HSM appreciated MSK/EXT: No lower extremity edema SKIN: No jaundice NEURO:  Alert & Oriented x 3, no focal deficits   REVIEW OF  DATA  I reviewed the following data at the time of this encounter:  GI Procedures and Studies  Relevant studies to review  Laboratory Studies  3/12 IR guided mass biopsy results/pathology FINAL MICROSCOPIC DIAGNOSIS:   A. MESENTERIC MASS, MIDLINE, BIOPSY:  -Reactive lymphoid hyperplasia  -See comment  COMMENT:  The sections show needle core biopsy fragments of lymphoid tissue  displaying numerous variably sized lymphoid follicles with reactive  appearing germinal centers. The interfollicular zones are primarily  composed of small round to slightly irregular lymphocytes without overt  atypia. No granulomata or metastatic malignancy is identified. Flow  cytometric analysis was performed [facility] [phone]) and failed to show any  significant Tor B-cell phenotypic abnormalities. Immunohistochemical  stains for BCL2, CD3, CD5, CD20, CD79a, CD10, cyclin D1, cytokeratin  AE1/AE3, and cytokeratin 8/18 were also performed with appropriate  controls. The stains show a mixture of T  and B cells in the respective  compartments with predominance of T-cells. The germinal centers are  CD10 positive and BCL-2 negative. There is no significant cyclin D1 or  cytokeratin positivity identified. The overall appearance is  nonspecific but consistent with reactive lymphoid hyperplasia. In the  presence of a large mass, the changes may not be representative of the  disease process and hence, clinical correlation is recommended.   Imaging Studies  February 2021 CT abdomen pelvis with contrast IMPRESSION: 1. No acute findings identified within the abdomen or pelvis. 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 This is of uncertain etiology. Differential considerations include desmoid tumor, carcinoid tumor, GIST, lymphoma, or metastatic adenopathy. 2. There is a low-density mass with peripheral enhancement within the dome of liver which is favored to represent a benign abnormality such as hemangioma. This  could be confirmed with nonemergent contrast enhanced liver protocol MRI.   ASSESSMENT  Ms. Swisher is a 44 y.o. 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  The patient is seen today for evaluation and management of:  1. Mesenteric mass   2. Abnormal CT of the abdomen   3. Early satiety   4. Gastroesophageal reflux disease, unspecified whether esophagitis present    The etiology of the patient's mesenteric mass remains unclear.  She does have some symptoms of longer standing GERD but has not been on adequate acid suppression medication and has tried to control her symptoms with dietary and lifestyle modifications.  Evaluating the lesion on the CT scan myself, I do think there is a chance for obtaining an EUS guided biopsy of the lesion as well as performing diagnostic endoscopy.  The risks of EUS including bleeding, infection, aspiration pneumonia and intestinal perforation were discussed as was the possibility it may not give a definitive diagnosis.  I do not anticipate that this lesion is actually arising from the pancreas but, if a biopsy of the pancreas is done as part of the EUS, there is an additional risk of pancreatitis at the rate of about 1%.  It was explained that procedure related pancreatitis is typically mild, although can be severe and even life threatening, which is why we do not perform random pancreatic biopsies and only biopsy a lesion we feel is concerning enough to warrant the risk.  The risks and benefits of endoscopic evaluation were discussed with the patient; these include but are not limited to the risk of perforation, infection, bleeding, missed lesions, lack of diagnosis, severe illness requiring hospitalization, as well as anesthesia and sedation related illnesses.  The patient is agreeable to proceed.  All patient questions were answered, to the best of my ability, and the patient agrees to the aforementioned plan of action  with follow-up as indicated.   PLAN  Proceed with scheduling EGD/EUS with attempted FNB Consider H2 RA such as Pepcid 1-2 times daily or nightly Consider colonoscopy in future if negative work-up from above   Orders Placed This Encounter  Procedures  . Procedural/ Surgical Case Request: UPPER ESOPHAGEAL ENDOSCOPIC ULTRASOUND (EUS)  . Ambulatory referral to Gastroenterology    New Prescriptions   No medications on file   Modified Medications   No medications on file    Planned Follow Up No follow-ups on file.   Total Time in Face-to-Face and in Coordination of Care for patient including independent/personal interpretation/review of prior testing, medical history, examination, medication adjustment, communicating results with the patient directly, and documentation with the EHR is 45  minutes.   Justice Britain, MD [facility] Advanced Endoscopy Office # [phone]

## 2019-06-03 DIAGNOSIS — K219 Gastro-esophageal reflux disease without esophagitis: Secondary | ICD-10-CM | POA: Insufficient documentation

## 2019-06-05 ENCOUNTER — Telehealth: Payer: Self-pay | Admitting: *Deleted

## 2019-06-05 NOTE — Telephone Encounter (Signed)
-----   Message from Truitt Merle, MD sent at 06/02/2019 12:19 PM EDT ----- Please let pt know the lab results, all tumor markers were negative, LDH slightly elevated, non-specific.   Thanks  Truitt Merle

## 2019-06-05 NOTE — Telephone Encounter (Signed)
Called & left message for pt to return call to discuss good results.

## 2019-06-06 ENCOUNTER — Telehealth: Payer: Self-pay

## 2019-06-06 NOTE — Telephone Encounter (Signed)
TC to pt per Dr Burr Medico to let her know her  lab results, all tumor markers were negative, LDH slightly elevated, but non-specific. Patient verbalized understanding. No further problems or concerns at this time.

## 2019-06-11 ENCOUNTER — Telehealth: Payer: Self-pay

## 2019-06-14 ENCOUNTER — Other Ambulatory Visit (HOSPITAL_COMMUNITY)
Admission: RE | Admit: 2019-06-14 | Discharge: 2019-06-14 | Disposition: A | Discharge status: Home / Self Care | Payer: 59 | Source: Ambulatory Visit | Attending: Gastroenterology | Admitting: Gastroenterology

## 2019-06-14 DIAGNOSIS — Z01812 Encounter for preprocedural laboratory examination: Secondary | ICD-10-CM | POA: Diagnosis present

## 2019-06-14 DIAGNOSIS — Z20822 Contact with and (suspected) exposure to covid-19: Secondary | ICD-10-CM | POA: Insufficient documentation

## 2019-06-14 LAB — SARS CORONAVIRUS 2 (TAT 6-24 HRS): SARS Coronavirus 2: NEGATIVE

## 2019-06-15 NOTE — Progress Notes (Signed)
Unable to reach pt by phone for pre-op call. Left detail pre-op instructions on pt's voicemail.  Covid test done on 06/14/19 and it is negative. Reminded pt that she needs to be in quarantine until Monday when she arrives at the hospital (in message on voicemail).  EKG 04/23/19 in [place]

## 2019-06-18 ENCOUNTER — Encounter (HOSPITAL_COMMUNITY): Payer: Self-pay | Admitting: Gastroenterology

## 2019-06-18 ENCOUNTER — Other Ambulatory Visit: Payer: Self-pay

## 2019-06-18 ENCOUNTER — Ambulatory Visit (HOSPITAL_COMMUNITY): Payer: 59 | Admitting: Anesthesiology

## 2019-06-18 ENCOUNTER — Ambulatory Visit (HOSPITAL_COMMUNITY)
Admission: RE | Admit: 2019-06-18 | Discharge: 2019-06-18 | Disposition: A | Discharge status: Home / Self Care | Payer: 59 | Attending: Gastroenterology | Admitting: Gastroenterology

## 2019-06-18 ENCOUNTER — Encounter (HOSPITAL_COMMUNITY)
Admission: RE | Disposition: A | Discharge status: Home / Self Care | Payer: Self-pay | Source: Home / Self Care | Attending: Gastroenterology

## 2019-06-18 DIAGNOSIS — K8020 Calculus of gallbladder without cholecystitis without obstruction: Secondary | ICD-10-CM | POA: Diagnosis not present

## 2019-06-18 DIAGNOSIS — K3189 Other diseases of stomach and duodenum: Secondary | ICD-10-CM | POA: Diagnosis not present

## 2019-06-18 DIAGNOSIS — R6881 Early satiety: Secondary | ICD-10-CM | POA: Diagnosis present

## 2019-06-18 DIAGNOSIS — K222 Esophageal obstruction: Secondary | ICD-10-CM | POA: Insufficient documentation

## 2019-06-18 DIAGNOSIS — K2100 Gastro-esophageal reflux disease with esophagitis, without bleeding: Secondary | ICD-10-CM | POA: Insufficient documentation

## 2019-06-18 DIAGNOSIS — K209 Esophagitis, unspecified without bleeding: Secondary | ICD-10-CM | POA: Diagnosis not present

## 2019-06-18 DIAGNOSIS — K449 Diaphragmatic hernia without obstruction or gangrene: Secondary | ICD-10-CM | POA: Diagnosis not present

## 2019-06-18 DIAGNOSIS — R935 Abnormal findings on diagnostic imaging of other abdominal regions, including retroperitoneum: Secondary | ICD-10-CM

## 2019-06-18 HISTORY — PX: ESOPHAGOGASTRODUODENOSCOPY (EGD) WITH PROPOFOL: SHX5813

## 2019-06-18 HISTORY — PX: FINE NEEDLE ASPIRATION: SHX5430

## 2019-06-18 HISTORY — PX: BIOPSY: SHX5522

## 2019-06-18 HISTORY — PX: UPPER ESOPHAGEAL ENDOSCOPIC ULTRASOUND (EUS): SHX6562

## 2019-06-18 SURGERY — UPPER ESOPHAGEAL ENDOSCOPIC ULTRASOUND (EUS)
Anesthesia: Monitor Anesthesia Care

## 2019-06-18 MED ORDER — PROPOFOL 500 MG/50ML IV EMUL
INTRAVENOUS | Status: DC | PRN
  Administered 2019-06-18: 150 ug/kg/min via INTRAVENOUS
  Administered 2019-06-18: 100 ug/kg/min via INTRAVENOUS
  Administered 2019-06-18: 150 ug/kg/min via INTRAVENOUS

## 2019-06-18 MED ORDER — LIDOCAINE 2% (20 MG/ML) 5 ML SYRINGE
INTRAMUSCULAR | Status: DC | PRN
  Administered 2019-06-18: 60 mg via INTRAVENOUS

## 2019-06-18 MED ORDER — OMEPRAZOLE 40 MG PO CPDR: 40.0000 mg | DELAYED_RELEASE_CAPSULE | Freq: Every day | ORAL | 4 refills | Status: DC

## 2019-06-18 MED ORDER — ONDANSETRON HCL 4 MG/2ML IJ SOLN
INTRAMUSCULAR | Status: DC | PRN
  Administered 2019-06-18: 4 mg via INTRAVENOUS

## 2019-06-18 MED ORDER — LACTATED RINGERS IV SOLN: INTRAVENOUS | Status: DC

## 2019-06-18 MED ORDER — GLYCOPYRROLATE 0.2 MG/ML IJ SOLN
INTRAMUSCULAR | Status: DC | PRN
  Administered 2019-06-18 (×2): .1 mg via INTRAVENOUS

## 2019-06-18 MED ORDER — SODIUM CHLORIDE 0.9 % IV SOLN: INTRAVENOUS | Status: DC

## 2019-06-18 NOTE — Anesthesia Postprocedure Evaluation (Signed)
Anesthesia Post Note  Patient: Conservator, museum/gallery  Procedure(s) Performed: UPPER ESOPHAGEAL ENDOSCOPIC ULTRASOUND (EUS) (N/A ) BIOPSY FINE NEEDLE ASPIRATION (FNA) LINEAR     Patient location during evaluation: Endoscopy Anesthesia Type: MAC Level of consciousness: awake and alert Pain management: pain level controlled Vital Signs Assessment: post-procedure vital signs reviewed and stable Respiratory status: spontaneous breathing, nonlabored ventilation, respiratory function stable and patient connected to nasal cannula oxygen Cardiovascular status: stable and blood pressure returned to baseline Postop Assessment: no apparent nausea or vomiting Anesthetic complications: no    Last Vitals:  Vitals:   06/18/19 0920 06/18/19 0930  BP: 107/63 113/62  Pulse: 71 61  Resp: (!) 23 17  Temp:    SpO2: 99% 100%    Last Pain:  Vitals:   06/18/19 1005  TempSrc:   PainSc: 0-No pain                 Elverda Wendel COKER

## 2019-06-18 NOTE — Transfer of Care (Signed)
Immediate Anesthesia Transfer of Care Note  Patient: Amy Frost  Procedure(s) Performed: UPPER ESOPHAGEAL ENDOSCOPIC ULTRASOUND (EUS) (N/A ) BIOPSY FINE NEEDLE ASPIRATION (FNA) LINEAR  Patient Location: PACU and Endoscopy Unit  Anesthesia Type:MAC  Level of Consciousness: awake and patient cooperative  Airway & Oxygen Therapy: Patient Spontanous Breathing and Patient connected to nasal cannula oxygen  Post-op Assessment: Report given to RN and Post -op Vital signs reviewed and stable  Post vital signs: Reviewed and stable  Last Vitals:  Vitals Value Taken Time  BP 103/52 06/18/19 0900  Temp 36.3 C 06/18/19 0849  Pulse 68 06/18/19 0902  Resp 23 06/18/19 0902  SpO2 100 % 06/18/19 0902  Vitals shown include unvalidated device data.  Last Pain:  Vitals:   06/18/19 0849  TempSrc: Temporal  PainSc: 0-No pain         Complications: No apparent anesthesia complications

## 2019-06-18 NOTE — Discharge Instructions (Signed)
YOU HAD AN ENDOSCOPIC PROCEDURE TODAY: Refer to the procedure report and other information in the discharge instructions given to you for any specific questions about what was found during the examination. If this information does not answer your questions, please call [place] office at [phone] to clarify.  ° °YOU SHOULD EXPECT: Some feelings of bloating in the abdomen. Passage of more gas than usual. Walking can help get rid of the air that was put into your GI tract during the procedure and reduce the bloating. If you had a lower endoscopy (such as a colonoscopy or flexible sigmoidoscopy) you may notice spotting of blood in your stool or on the toilet paper. Some abdominal soreness may be present for a day or two, also. ° °DIET: Your first meal following the procedure should be a light meal and then it is ok to progress to your normal diet. A half-sandwich or bowl of soup is an example of a good first meal. Heavy or fried foods are harder to digest and may make you feel nauseous or bloated. Drink plenty of fluids but you should avoid alcoholic beverages for 24 hours. If you had a esophageal dilation, please see attached instructions for diet.   ° °ACTIVITY: Your care partner should take you home directly after the procedure. You should plan to take it easy, moving slowly for the rest of the day. You can resume normal activity the day after the procedure however YOU SHOULD NOT DRIVE, use power tools, machinery or perform tasks that involve climbing or major physical exertion for 24 hours (because of the sedation medicines used during the test).  ° °SYMPTOMS TO REPORT IMMEDIATELY: °A gastroenterologist can be reached at any hour. Please call [phone]  for any of the following symptoms:  °Following lower endoscopy (colonoscopy, flexible sigmoidoscopy) °Excessive amounts of blood in the stool  °Significant tenderness, worsening of abdominal pains  °Swelling of the abdomen that is new, acute  °Fever of 100° or  higher  °Following upper endoscopy (EGD, EUS, ERCP, esophageal dilation) °Vomiting of blood or coffee ground material  °New, significant abdominal pain  °New, significant chest pain or pain under the shoulder blades  °Painful or persistently difficult swallowing  °New shortness of breath  °Black, tarry-looking or red, bloody stools ° °FOLLOW UP:  °If any biopsies were taken you will be contacted by phone or by letter within the next 1-3 weeks. Call [phone]  if you have not heard about the biopsies in 3 weeks.  °Please also call with any specific questions about appointments or follow up tests. ° °

## 2019-06-18 NOTE — Anesthesia Procedure Notes (Signed)
Procedure Name: MAC Performed by: Huda Petrey P, CRNA Pre-anesthesia Checklist: Patient identified, Emergency Drugs available, Suction available, Patient being monitored and Timeout performed Patient Re-evaluated:Patient Re-evaluated prior to induction Oxygen Delivery Method: Nasal cannula Placement Confirmation: positive ETCO2 Dental Injury: Teeth and Oropharynx as per pre-operative assessment        

## 2019-06-18 NOTE — Interval H&P Note (Signed)
History and Physical Interval Note:  06/18/2019 7:00 AM  Amy Frost  has presented today for surgery, with the diagnosis of Abnormal CT scan abd , Early Satiety.  The various methods of treatment have been discussed with the patient and family. After consideration of risks, benefits and other options for treatment, the patient has consented to  Procedure(s): UPPER ESOPHAGEAL ENDOSCOPIC ULTRASOUND (EUS) (N/A) as a surgical intervention.  The patient's history has been reviewed, patient examined, no change in status, stable for surgery.  I have reviewed the patient's chart and labs.  Questions were answered to the patient's satisfaction.    The risks of EUS including bleeding, infection, aspiration pneumonia and intestinal perforation were discussed as was the possibility it may not give a definitive diagnosis.  If a biopsy of the pancreas is done as part of the EUS, there is an additional risk of pancreatitis at the rate of about 1%.  It was explained that procedure related pancreatitis is typically mild, although can be severe and even life threatening, which is why we do not perform random pancreatic biopsies and only biopsy a lesion we feel is concerning enough to warrant the risk.    [employer]

## 2019-06-18 NOTE — Op Note (Signed)
[facility] Patient Name: Amy Frost Procedure Date : 06/18/2019 MRN: [id number] Attending MD: Justice Britain , MD Date of Birth: [birth year] CSN: [id number] Age: 44 Admit Type: Outpatient Procedure:                Upper EUS Indications:              Suspected mass on abdominal/pelvic CT scan,                            Heartburn, Early satiety Providers:                Justice Britain, MD, Grace Isaac, RN, Laverda Sorenson, Technician, Cira Servant, CRNA Referring MD:             Truitt Merle Medicines:                Monitored Anesthesia Care Complications:            No immediate complications. Estimated Blood Loss:     Estimated blood loss was minimal. Procedure:                Pre-Anesthesia Assessment:                           - Prior to the procedure, a History and Physical                            was performed, and patient medications and                            allergies were reviewed. The patient's tolerance of                            previous anesthesia was also reviewed. The risks                            and benefits of the procedure and the sedation                            options and risks were discussed with the patient.                            All questions were answered, and informed consent                            was obtained. Prior Anticoagulants: The patient has                            taken no previous anticoagulant or antiplatelet                            agents. ASA Grade Assessment: I - A normal, healthy  patient. After reviewing the risks and benefits,                            the patient was deemed in satisfactory condition to                            undergo the procedure.                           After obtaining informed consent, the endoscope was                            passed under direct vision. Throughout the                            procedure, the  patient's blood pressure, pulse, and                            oxygen saturations were monitored continuously. The                            [id number]-H190 ([id number]) Olympus gastroscope was                            introduced through the mouth, and advanced to the                            second part of duodenum. The GF-[id number] ([id number])                            Olympus Linear EUS scope was introduced through the                            mouth, and advanced to the duodenum for ultrasound                            examination from the stomach and duodenum. The                            upper EUS was accomplished without difficulty. The                            patient tolerated the procedure. Scope In: Scope Out: Findings:      ENDOSCOPIC FINDING: :      No gross lesions were noted in the proximal esophagus and in the mid       esophagus.      LA Grade C (one or more mucosal breaks continuous between tops of 2 or       more mucosal folds, less than 75% circumference) esophagitis with no       bleeding was found in the distal esophagus and the GE Junction. Biopsies       were taken with a cold forceps for histology.      A widely patent and non-obstructing Schatzki ring was found at the  gastroesophageal junction.      A 3 cm hiatal hernia was found. The proximal extent of the gastric folds       (end of tubular esophagus) was 38 cm from the incisors. The hiatal       narrowing was 40 cm from the incisors. The Z-line was 37 cm from the       incisors.      Patchy mildly erythematous mucosa without bleeding was found in the       gastric body, in the gastric antrum and in the prepyloric region of the       stomach.      No other gross lesions were noted in the entire examined stomach.       Biopsies were taken with a cold forceps for histology and Helicobacter       pylori testing.      No gross lesions were noted in the duodenal bulb, in the first portion       of the  duodenum and in the second portion of the duodenum.      ENDOSONOGRAPHIC FINDING: :      An irregular mass was identified endosonographically in the       perigastric/peripancreatic space. The mass was hypoechoic. The mass       measured 58 mm by 27 mm in maximal cross-sectional diameter (consistent       in area and location of CT imaging of mass-lesion). The endosonographic       borders were well-defined. 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 An intact interface was seen between the       mass and the stomach, duodenum, portal vein, pancreas, liver and spleen       suggesting a lack of invasion. Fine needle biopsy was performed. Color       Doppler imaging was utilized prior to needle puncture to confirm a lack       of significant vascular structures within the needle path. Eight passes       were made with the 22 gauge Acquire ultrasound core biopsy needle using       a transgastric approach (3 for slides, 3 for cell block, 2 for Flow).       Visible cores of tissue were obtained. Preliminary cytologic examination       and touch preps were performed. Final cytology results are pending.      There was no sign of significant endosonographic abnormality in the       pancreatic head (0.7 mm), genu of the pancreas (1.2 mm), pancreatic body       (0.5 mm) and pancreatic tail (0.8 mm). No masses, no cysts, no       calcifications, the pancreatic duct was regular in contour.      There was no sign of significant endosonographic abnormality in the       common bile duct (1.9 mm -> 3.0 mm) and in the common hepatic duct (3.8       mm). No stones, no biliary sludge and ducts of normal caliber were       identified.      Multiple stones were visualized endosonographically in the gallbladder.       The stones were round and hyperechoic and characterized by shadowing.      Endosonographic imaging in the visualized portion of the left liver       showed no  mass-lesion.      No malignant-appearing lymph nodes were visualized in the celiac region       (level 20), peripancreatic region and porta hepatis region.      The celiac region was visualized. Impression:               EGD Impression:                           - No gross lesions in esophagus proximally. LA                            Grade C esophagitis with no bleeding distally and                            at [employer]. Biopsied.                           - Widely patent and non-obstructing Schatzki ring.                           - 3 cm hiatal hernia.                           - Erythematous mucosa in the gastric body, antrum                            and prepyloric region of the stomach. Biopsied.                           - No gross lesions in the duodenal bulb, in the                            first portion of the duodenum and in the second                            portion of the duodenum.                           EUS Impression:                           - A mass measuring 58 mm by 27 mm was identified                            endosonographically in the                            perigastric/peripancreatic region. Fine needle                            biopsy performed.                           - There was no sign of significant pathology in the  pancreatic head, genu of the pancreas, pancreatic                            body and pancreatic tail.                           - There was no sign of significant pathology in the                            common bile duct and in the common hepatic duct.                           - Multiple stones were visualized                            endosonographically in the gallbladder.                           - No malignant-appearing lymph nodes were                            visualized in the celiac region (level 20),                            peripancreatic region and porta hepatis  region. Recommendation:           - The patient will be observed post-procedure,                            until all discharge criteria are met.                           - Discharge patient to home.                           - Patient has a contact number available for                            emergencies. The signs and symptoms of potential                            delayed complications were discussed with the                            patient. Return to normal activities tomorrow.                            Written discharge instructions were provided to the                            patient.                           - Observe patient's clinical course.                           -  Await cytology results and await path results.                           - Start Omeprazole 40 mg daily.                           - Repeat EGD in 66-month to check on healing. If                            dysphagia symptoms are present then will consider                            dilation if esophagitis has healed.                           - Depending on findings will need to discuss next                            steps including potential role of Colonoscopy for                            diagnostic evaluation and potentially MDC                            discussion.                           - For early satiety query SF-GES study.                           - The findings and recommendations were discussed                            with the patient. Procedure Code(s):        --- Professional ---                           [phone] Esophagogastroduodenoscopy, flexible,                            transoral; with transendoscopic ultrasound-guided                            intramural or transmural fine needle                            aspiration/biopsy(s), (includes endoscopic                            ultrasound examination limited to the esophagus,                            stomach or  duodenum, and adjacent structures) Diagnosis Code(s):        --- Professional ---  K20.90, Esophagitis, unspecified without bleeding                           K22.2, Esophageal obstruction                           K44.9, Diaphragmatic hernia without obstruction or                            gangrene                           K31.89, Other diseases of stomach and duodenum                           K66.8, Other specified disorders of peritoneum                           K80.20, Calculus of gallbladder without                            cholecystitis without obstruction                           I89.9, Noninfective disorder of lymphatic vessels                            and lymph nodes, unspecified                           R12, Heartburn                           R68.81, Early satiety                           R93.5, Abnormal findings on diagnostic imaging of                            other abdominal regions, including retroperitoneum CPT copyright 2019 American Medical Association. All rights reserved. The codes documented in this report are preliminary and upon coder review may  be revised to meet current compliance requirements. Justice Britain, MD 06/18/2019 9:16:41 AM Number of Addenda: 0

## 2019-06-18 NOTE — Anesthesia Preprocedure Evaluation (Signed)
Anesthesia Evaluation  Patient identified by MRN, date of birth, ID band Patient awake    Reviewed: Allergy & Precautions, NPO status , Patient's Chart, lab work & pertinent test results  Airway Mallampati: II  TM Distance: >3 FB Neck ROM: Full    Dental  (+) Teeth Intact, Dental Advisory Given   Pulmonary    breath sounds clear to auscultation       Cardiovascular  Rhythm:Regular Rate:Normal     Neuro/Psych    GI/Hepatic   Endo/Other    Renal/GU      Musculoskeletal   Abdominal   Peds  Hematology   Anesthesia Other Findings   Reproductive/Obstetrics                             Anesthesia Physical Anesthesia Plan  ASA: II  Anesthesia Plan: MAC   Post-op Pain Management:    Induction: Intravenous  PONV Risk Score and Plan: Ondansetron  Airway Management Planned: Natural Airway and Simple Face Mask  Additional Equipment:   Intra-op Plan:   Post-operative Plan:   Informed Consent: I have reviewed the patients History and Physical, chart, labs and discussed the procedure including the risks, benefits and alternatives for the proposed anesthesia with the patient or authorized representative who has indicated his/her understanding and acceptance.     Dental advisory given  Plan Discussed with: Anesthesiologist and CRNA  Anesthesia Plan Comments:         Anesthesia Quick Evaluation

## 2019-06-19 ENCOUNTER — Other Ambulatory Visit: Payer: Self-pay | Admitting: Physician Assistant

## 2019-06-19 ENCOUNTER — Telehealth: Payer: Self-pay | Admitting: Gastroenterology

## 2019-06-19 ENCOUNTER — Other Ambulatory Visit: Payer: Self-pay

## 2019-06-19 DIAGNOSIS — Z9889 Other specified postprocedural states: Secondary | ICD-10-CM

## 2019-06-19 DIAGNOSIS — R935 Abnormal findings on diagnostic imaging of other abdominal regions, including retroperitoneum: Secondary | ICD-10-CM

## 2019-06-19 DIAGNOSIS — R109 Unspecified abdominal pain: Secondary | ICD-10-CM

## 2019-06-19 LAB — SURGICAL PATHOLOGY

## 2019-06-19 NOTE — Telephone Encounter (Signed)
Thank you for update. Buford. GM

## 2019-06-19 NOTE — Telephone Encounter (Signed)
Per V.O from Dr Rush Landmark if the pt cannot tolerate the discomfort she needs to go to the ED for evaluation.  If she can wait for 2 days he is ok with waiting if things are not worsening.    I spoke with the pt and she prefers to go to the ED for evaluation today.

## 2019-06-19 NOTE — Telephone Encounter (Signed)
Lab order entered- tried to set up CT scan however, per [place] pre cert coordinator the pt insurance requires all records to be faxed prior to a prior auth.  It could take up to 48 hours for a decision.  Would you like the pt to go to the ED for evaluation?

## 2019-06-19 NOTE — Telephone Encounter (Signed)
Patient called [facility] Frost endoscopy this afternoon. Patient describes acute onset of left upper quadrant abdominal discomfort that worsens with movement. No worsening or alleviation of pain with eating or drinking but she is doing fine with that.  She is passing bowel movements.  She was feeling perfectly well on Tuesday. She denies any dizziness or lightheadedness or presyncope symptoms. To be as thoughtful as possible, we need to move forward with obtaining a CBC/CMP and proceed with scheduling a CT abdomen/pelvis with IV and oral contrast.  I want to ensure that she has not developed a postprocedural complication such as bleeding from our recent EUS with FNB. She seems to be hemodynamically stable and tolerating things at this time. We will plan to proceed with doing this as an outpatient and try to get the CAT scan done in the next 24 hours. If the patient has progression of her symptoms then she knows to call us back but understands that we are going to direct her to the emergency department to get more urgent evaluation. She may use Tylenol but no nonsteroidals for pain control at this time. Patient appreciative for the call back.  Patty, please move forward with scheduling labs and more importantly CT scan.  Justice Britain, MD [facility] # [id number]

## 2019-06-20 ENCOUNTER — Other Ambulatory Visit: Payer: Self-pay

## 2019-06-20 ENCOUNTER — Ambulatory Visit (INDEPENDENT_AMBULATORY_CARE_PROVIDER_SITE_OTHER)
Admission: RE | Admit: 2019-06-20 | Discharge: 2019-06-20 | Disposition: A | Discharge status: Home / Self Care | Payer: 59 | Source: Ambulatory Visit | Attending: Gastroenterology | Admitting: Gastroenterology

## 2019-06-20 ENCOUNTER — Other Ambulatory Visit (INDEPENDENT_AMBULATORY_CARE_PROVIDER_SITE_OTHER): Payer: 59

## 2019-06-20 DIAGNOSIS — R109 Unspecified abdominal pain: Secondary | ICD-10-CM

## 2019-06-20 DIAGNOSIS — Z9889 Other specified postprocedural states: Secondary | ICD-10-CM

## 2019-06-20 LAB — COMPREHENSIVE METABOLIC PANEL
ALT: 17 U/L (ref 0–35)
AST: 14 U/L (ref 0–37)
Albumin: 4.4 g/dL (ref 3.5–5.2)
Alkaline Phosphatase: 65 U/L (ref 39–117)
BUN: 9 mg/dL (ref 6–23)
CO2: 29 mEq/L (ref 19–32)
Calcium: 9.1 mg/dL (ref 8.4–10.5)
Chloride: 102 mEq/L (ref 96–112)
Creatinine, Ser: 0.69 mg/dL (ref 0.40–1.20)
GFR: 111.82 mL/min (ref >60.00)
Glucose, Bld: 120 mg/dL — ABNORMAL HIGH (ref 70–99)
Potassium: 3.7 mEq/L (ref 3.5–5.1)
Sodium: 139 mEq/L (ref 135–145)
Total Bilirubin: 0.3 mg/dL (ref 0.2–1.2)
Total Protein: 7.1 g/dL (ref 6.0–8.3)

## 2019-06-20 LAB — CBC WITH DIFFERENTIAL/PLATELET
Basophils Absolute: 0 10*3/uL (ref 0.0–0.1)
Basophils Relative: 0.7 % (ref 0.0–3.0)
Eosinophils Absolute: 0.1 10*3/uL (ref 0.0–0.7)
Eosinophils Relative: 2.8 % (ref 0.0–5.0)
HCT: 34.7 % — ABNORMAL LOW (ref 36.0–46.0)
Hemoglobin: 11.1 g/dL — ABNORMAL LOW (ref 12.0–15.0)
Lymphocytes Relative: 21.3 % (ref 12.0–46.0)
Lymphs Abs: 1.1 10*3/uL (ref 0.7–4.0)
MCHC: 32.2 g/dL (ref 30.0–36.0)
MCV: 80.4 fl (ref 78.0–100.0)
Monocytes Absolute: 0.4 10*3/uL (ref 0.1–1.0)
Monocytes Relative: 7.6 % (ref 3.0–12.0)
Neutro Abs: 3.6 10*3/uL (ref 1.4–7.7)
Neutrophils Relative %: 67.6 % (ref 43.0–77.0)
Platelets: 259 10*3/uL (ref 150.0–400.0)
RBC: 4.31 Mil/uL (ref 3.87–5.11)
RDW: 16 % — ABNORMAL HIGH (ref 11.5–15.5)
WBC: 5.3 10*3/uL (ref 4.0–10.5)

## 2019-06-20 LAB — CYTOLOGY - NON PAP

## 2019-06-20 LAB — SURGICAL PATHOLOGY

## 2019-06-20 NOTE — Telephone Encounter (Signed)
The pt has been advised and will come in today for labs and xray

## 2019-06-20 NOTE — Telephone Encounter (Signed)
The pt states that she feels much better today and has no pains today.  She states she does not believe she needs to have a CT at this time. Please advise

## 2019-06-20 NOTE — Progress Notes (Signed)
.  ku

## 2019-06-20 NOTE — Telephone Encounter (Signed)
If patient pain and discomfort is completely improved that is good news to hear. I would still like for her to come and get labs and at least get a 2-view KUB. She can come in before end of the week if she is doing well. Thanks. GM

## 2019-06-20 NOTE — Telephone Encounter (Signed)
Patty, Please reach out to the patient as I do not see she actually went to the emergency department at [facility].  If she went somewhere else we need to find out where she is and what the records show from that visit.  Otherwise get her scheduled for the CT scan as we had initially planned. Please let me know what has happened from last night into this morning. Thanks. GM

## 2019-06-20 NOTE — Telephone Encounter (Signed)
No calls from the ER or the patient last evening.  Agree, does not look like she went anywhere within our system overnight.

## 2019-06-22 LAB — 5 HIAA, QUANTITATIVE, URINE, 24 HOUR
5-HIAA, Ur: 7.8 mg/L
5-HIAA,Quant.,24 Hr Urine: 2 mg/24 hr (ref 0.0–14.9)
Total Volume: 250

## 2019-06-26 ENCOUNTER — Telehealth: Payer: Self-pay | Admitting: *Deleted

## 2019-06-26 NOTE — Telephone Encounter (Signed)
-----   Message from Truitt Merle, MD sent at 06/25/2019 10:41 PM EDT ----- Please let pt know her urine test was negative, thanks   Truitt Merle  06/25/2019

## 2019-06-26 NOTE — Telephone Encounter (Signed)
Called pt & informed of negative urine results.

## 2019-06-27 ENCOUNTER — Encounter: Payer: Self-pay | Admitting: Gastroenterology

## 2019-07-11 ENCOUNTER — Telehealth: Payer: Self-pay | Admitting: Gastroenterology

## 2019-07-11 ENCOUNTER — Other Ambulatory Visit: Payer: Self-pay

## 2019-07-11 NOTE — Telephone Encounter (Signed)
Thanks.  I have talked to hem pathologist Dr. Gari Crown. He and Dr. Tresa Moore both reviewed the case again and they do not feel second opinion at other pathology lab is necessary.   Truitt Merle MD

## 2019-07-11 NOTE — Telephone Encounter (Signed)
Patient's case discussed that [place] today on 5/5 AM. Reviewed history and imaging and pathology and endoscopic ultrasound imaging report. After discussion, further stains and a second opinion from a pathology perspective will be pursued. Dr. Burr Medico will proceed with asking for a second opinion pathology consultation and likely repeat imaging in the course of the coming months. She will coordinate this as well as repeat cross-sectional imaging. She has a follow-up with Dr. Burr Medico next month. I tried to call the patient this afternoon but was not able to get a hold of her but left a voicemail.  Patty, when she calls back tomorrow or the next day please let her know what we have discussed here but I am also happy to talk with her at some point if she gives [place] a time when it will be best to reach her.  Justice Britain, MD [facility] # [id number]

## 2019-07-12 NOTE — Telephone Encounter (Signed)
Let another voicemail today for patient to call back. Patty, in case she calls you can please get her number and I'll try and talk with her about things. She will follow up with Dr. Burr Medico as scheduled in June. No further GI workup at this time. Thanks. GM

## 2019-08-22 ENCOUNTER — Inpatient Hospital Stay: Payer: 59 | Attending: Hematology

## 2019-08-22 ENCOUNTER — Other Ambulatory Visit: Payer: Self-pay

## 2019-08-22 DIAGNOSIS — R1909 Other intra-abdominal and pelvic swelling, mass and lump: Secondary | ICD-10-CM | POA: Insufficient documentation

## 2019-08-22 DIAGNOSIS — K6389 Other specified diseases of intestine: Secondary | ICD-10-CM

## 2019-08-22 LAB — CBC WITH DIFFERENTIAL (CANCER CENTER ONLY)
Abs Immature Granulocytes: 0.01 10*3/uL (ref 0.00–0.07)
Basophils Absolute: 0 10*3/uL (ref 0.0–0.1)
Basophils Relative: 1 %
Eosinophils Absolute: 0.1 10*3/uL (ref 0.0–0.5)
Eosinophils Relative: 3 %
HCT: 36.8 % (ref 36.0–46.0)
Hemoglobin: 11.4 g/dL — ABNORMAL LOW (ref 12.0–15.0)
Immature Granulocytes: 0 %
Lymphocytes Relative: 25 %
Lymphs Abs: 1.1 10*3/uL (ref 0.7–4.0)
MCH: 25.5 pg — ABNORMAL LOW (ref 26.0–34.0)
MCHC: 31 g/dL (ref 30.0–36.0)
MCV: 82.3 fL (ref 80.0–100.0)
Monocytes Absolute: 0.4 10*3/uL (ref 0.1–1.0)
Monocytes Relative: 8 %
Neutro Abs: 2.9 10*3/uL (ref 1.7–7.7)
Neutrophils Relative %: 63 %
Platelet Count: 282 10*3/uL (ref 150–400)
RBC: 4.47 MIL/uL (ref 3.87–5.11)
RDW: 14.9 % (ref 11.5–15.5)
WBC Count: 4.6 10*3/uL (ref 4.0–10.5)
nRBC: 0 % (ref 0.0–0.2)

## 2019-08-22 LAB — CMP (CANCER CENTER ONLY)
ALT: 11 U/L (ref 0–44)
AST: 11 U/L — ABNORMAL LOW (ref 15–41)
Albumin: 4.1 g/dL (ref 3.5–5.0)
Alkaline Phosphatase: 68 U/L (ref 38–126)
Anion gap: 10 (ref 5–15)
BUN: 9 mg/dL (ref 6–20)
CO2: 25 mmol/L (ref 22–32)
Calcium: 9 mg/dL (ref 8.9–10.3)
Chloride: 107 mmol/L (ref 98–111)
Creatinine: 0.8 mg/dL (ref 0.44–1.00)
GFR, Est AFR Am: >60 mL/min (ref >60)
GFR, Estimated: >60 mL/min (ref >60)
Glucose, Bld: 111 mg/dL — ABNORMAL HIGH (ref 70–99)
Potassium: 3.9 mmol/L (ref 3.5–5.1)
Sodium: 142 mmol/L (ref 135–145)
Total Bilirubin: 0.3 mg/dL (ref 0.3–1.2)
Total Protein: 7.5 g/dL (ref 6.5–8.1)

## 2019-08-24 ENCOUNTER — Inpatient Hospital Stay: Payer: 59 | Admitting: Hematology

## 2019-08-24 ENCOUNTER — Telehealth: Payer: Self-pay

## 2019-08-24 NOTE — Telephone Encounter (Signed)
I spoke with MS Marlou Sa. Per Dr Burr Medico b/c the ct scan was not scheduled her appt for today will be canceled.  Scan will be scheduled for mid July with f.u with dr Burr Medico after.  Ms Marlou Sa verbalized understanding.

## 2019-09-24 ENCOUNTER — Telehealth: Payer: Self-pay

## 2019-09-24 NOTE — Telephone Encounter (Signed)
LM for Amy Frost f/u with scheduling CT abdomen/pelvis scan, Notified Amy Frost of number to call at [facility] Radiology here at [facility] to scheduled at [phone]. Informed Amy Frost to call back with any questions or concerns.

## 2019-09-27 ENCOUNTER — Inpatient Hospital Stay: Payer: 59 | Admitting: Hematology

## 2019-09-28 ENCOUNTER — Ambulatory Visit (AMBULATORY_SURGERY_CENTER): Payer: Self-pay

## 2019-09-28 ENCOUNTER — Other Ambulatory Visit: Payer: Self-pay

## 2019-09-28 VITALS — Ht 65.5 in | Wt 220.2 lb

## 2019-09-28 DIAGNOSIS — K6389 Other specified diseases of intestine: Secondary | ICD-10-CM

## 2019-09-28 DIAGNOSIS — R6881 Early satiety: Secondary | ICD-10-CM

## 2019-09-28 DIAGNOSIS — K219 Gastro-esophageal reflux disease without esophagitis: Secondary | ICD-10-CM

## 2019-09-28 DIAGNOSIS — R935 Abnormal findings on diagnostic imaging of other abdominal regions, including retroperitoneum: Secondary | ICD-10-CM

## 2019-09-28 NOTE — Progress Notes (Incomplete)
[place]   Telephone:(336) [phone] Fax:(336) [phone]   Clinic Follow up Note   Patient Care Team: Patient, No Pcp Per as PCP - General (General Practice) Jonnie Finner, RN as Oncology Nurse Navigator  Date of Service:  09/28/2019  CHIEF COMPLAINT: F/u of Mesenteric Mass  CURRENT THERAPY:  Observation***  INTERVAL HISTORY: *** Amy Frost is here for a follow up of mesenteric Mass. She presents to the clinic alone.    REVIEW OF SYSTEMS:  *** Constitutional: Denies fevers, chills or abnormal weight loss Eyes: Denies blurriness of vision Ears, nose, mouth, throat, and face: Denies mucositis or sore throat Respiratory: Denies cough, dyspnea or wheezes Cardiovascular: Denies palpitation, chest discomfort or lower extremity swelling Gastrointestinal:  Denies nausea, heartburn or change in bowel habits Skin: Denies abnormal skin rashes Lymphatics: Denies new lymphadenopathy or easy bruising Neurological:Denies numbness, tingling or new weaknesses Behavioral/Psych: Mood is stable, no new changes  All other systems were reviewed with the patient and are negative.  MEDICAL HISTORY:  Past Medical History:  Diagnosis Date  . GERD (gastroesophageal reflux disease)   . Mesenteric mass 2021    SURGICAL HISTORY: Past Surgical History:  Procedure Laterality Date  . BIOPSY  06/18/2019   Procedure: BIOPSY;  Surgeon: Rush Landmark Telford Nab., MD;  Location: [employer];  Service: Gastroenterology;;  . ESOPHAGOGASTRODUODENOSCOPY (EGD) WITH PROPOFOL N/A 06/18/2019   Procedure: ESOPHAGOGASTRODUODENOSCOPY (EGD) WITH PROPOFOL;  Surgeon: Irving Copas., MD;  Location: [place];  Service: Gastroenterology;  Laterality: N/A;  . FINE NEEDLE ASPIRATION  06/18/2019   Procedure: FINE NEEDLE ASPIRATION (FNA) LINEAR;  Surgeon: Irving Copas., MD;  Location: [place];  Service: Gastroenterology;;  . NO PAST SURGERIES    . UPPER ESOPHAGEAL ENDOSCOPIC  ULTRASOUND (EUS) N/A 06/18/2019   Procedure: UPPER ESOPHAGEAL ENDOSCOPIC ULTRASOUND (EUS);  Surgeon: Irving Copas., MD;  Location: [place];  Service: Gastroenterology;  Laterality: N/A;  . UPPER GASTROINTESTINAL ENDOSCOPY     2021    I have reviewed the social history and family history with the patient and they are unchanged from previous note.  ALLERGIES:  has No Known Allergies.  MEDICATIONS:  Current Outpatient Medications  Medication Sig Dispense Refill  . omeprazole (PRILOSEC) 40 MG capsule Take 1 capsule (40 mg total) by mouth daily. 30 capsule 4   No current facility-administered medications for this visit.    PHYSICAL EXAMINATION: ECOG PERFORMANCE STATUS: {CHL ONC ECOG [phone]}  There were no vitals filed for this visit. There were no vitals filed for this visit. *** GENERAL:alert, no distress and comfortable SKIN: skin color, texture, turgor are normal, no rashes or significant lesions EYES: normal, Conjunctiva are pink and non-injected, sclera clear {OROPHARYNX:no exudate, no erythema and lips, buccal mucosa, and tongue normal}  NECK: supple, thyroid normal size, non-tender, without nodularity LYMPH:  no palpable lymphadenopathy in the cervical, axillary {or inguinal} LUNGS: clear to auscultation and percussion with normal breathing effort HEART: regular rate & rhythm and no murmurs and no lower extremity edema ABDOMEN:abdomen soft, non-tender and normal bowel sounds Musculoskeletal:no cyanosis of digits and no clubbing  NEURO: alert & oriented x 3 with fluent speech, no focal motor/sensory deficits  LABORATORY DATA:  I have reviewed the data as listed CBC Latest Ref Rng & Units 08/22/2019 06/20/2019 05/18/2019  WBC 4.0 - 10.5 K/uL 4.6 5.3 6.1  Hemoglobin 12.0 - 15.0 g/dL 11.4(L) 11.1(L) 11.7(L)  Hematocrit 36 - 46 % 36.8 34.7(L) 38.4  Platelets 150 - 400 K/uL 282 259.0 288  CMP Latest Ref Rng & Units 08/22/2019 06/20/2019 04/23/2019  Glucose  70 - 99 mg/dL 111(H) 120(H) 99  BUN 6 - 20 mg/dL 9 9 11   Creatinine 0.44 - 1.00 mg/dL 0.80 0.69 0.76  Sodium 135 - 145 mmol/L 142 139 139  Potassium 3.5 - 5.1 mmol/L 3.9 3.7 4.2  Chloride 98 - 111 mmol/L 107 102 103  CO2 22 - 32 mmol/L 25 29 25   Calcium 8.9 - 10.3 mg/dL 9.0 9.1 9.5  Total Protein 6.5 - 8.1 g/dL 7.5 7.1 7.8  Total Bilirubin 0.3 - 1.2 mg/dL 0.3 0.3 0.8  Alkaline Phos 38 - 126 U/L 68 65 70  AST 15 - 41 U/L 11(L) 14 35  ALT 0 - 44 U/L 11 17 33      RADIOGRAPHIC STUDIES: I have personally reviewed the radiological images as listed and agreed with the findings in the report. No results found.   ASSESSMENT & PLAN:  Amy Frost is a 44 y.o. female with    1. Mesenteric Mass  -This is likely incidental finding.Her CT angio chest and CT AP from 04/23/19 showed 5.9cm mesenteric mass. It does not appear to be involving attached to her small bowel, could be a lymph node but no other adenopathy on CT -We discussed her biopsy from 05/18/19 which showed reactive lymphoid hyperplasia. I discussed it is very unusual for a reactive LN to be this large size, indolent lymphoma is not completed ruled out due to sampling issue. Other possibility of neuroendocrine tumor, or metastatic disease is less likely but not ruled out  -Her tumor markers (CEA, CA 19.9, CA 125, 24 hr urine) were normal except LDH 207.  *** -We discussed her CT AP from 10/02/19 which showed ***   -I recommend lab work for tumor markers including chromogranin A, 24-hour urine 5 HIAA, CEA, CA 19.9, CA125, LDH etc. I also recommend endoscopy work up to rule out GI malignancy of colitis. If workup all normal I recommend to monitor with repeat scan in 3 months. If further growth may need another biopsy. She is agreeable with more work up.  -I will call her with results and if needed f/u with a repeat CT scan in 3 months.    2. Mild Anemia  -She notes to having heavy menses every other month.  -Labs from 04/23/19 show  Hg 11.9.  -I recommend she take multivitamin with iron.    3. Financial support -Shepreviouslyworked for [employer]. She is currently not working. She is nervous about working outside of home  -She has not had medical insurance for the past 2 years and has not been seen by PCP or Gyn. She did apply for insurance in 01/2019 with [employer] but nor sure if they cover anything.  -I discussed she can speak with our financial advocate or SW about more information.  -I recommend she establish a PCP under [facility]    4. Over all health, Cancer screenings -She is overall healthy with no significant PMHx or FMHx. 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 She has not had surgery for removal as this is cosmetic surgery that her insurance does not cover -She has not had a mammogram before. She is of age and I can look into assistance or her insurance coverage for her to have a Mammogram yearly. She is interested.   PLAN: ***  -Lab in 1-2 weeks  -referral to [facility] for endoscopy  -f/u in 3 months with lab and CT abdomen/pel w contrast  a few days before    No problem-specific Assessment & Plan notes found for this encounter.   No orders of the defined types were placed in this encounter.  All questions were answered. The patient knows to call the clinic with any problems, questions or concerns. No barriers to learning was detected. The total time spent in the appointment was {CHL ONC TIME VISIT - DZHGD:[id number]}.     Joslyn Devon 09/28/2019   Oneal Deputy, am acting as scribe for Truitt Merle, MD.   {Add scribe attestation statement}

## 2019-09-28 NOTE — Progress Notes (Signed)
No egg or soy allergy known to patient  No issues with past sedation with any surgeries or procedures no intubation problems in the past  No diet pills per patient No home 02 use per patient  No blood thinners per patient  Pt denies issues with constipation  No A fib or A flutter  EMMI video to [place] 19 guidelines implemented in PV today   COVID screening scheduled on 10/10/2019 at 10:30 am; patient is aware of appt date/time;  Due to the COVID-19 pandemic we are asking patients to follow these guidelines. Please only bring one care partner. Please be aware that your care partner may wait in the car in the parking lot or if they feel like they will be too hot to wait in the car, they may wait in the lobby on the 4th floor. All care partners are required to wear a mask the entire time (we do not have any that we can provide them), they need to practice social distancing, and we will do a Covid check for all patient's and care partners when you arrive. Also we will check their temperature and your temperature. If the care partner waits in their car they need to stay in the parking lot the entire time and we will call them on their cell phone when the patient is ready for discharge so they can bring the car to the front of the building. Also all patient's will need to wear a mask into building.

## 2019-10-02 ENCOUNTER — Other Ambulatory Visit: Payer: Self-pay

## 2019-10-02 ENCOUNTER — Ambulatory Visit (HOSPITAL_COMMUNITY)
Admission: RE | Admit: 2019-10-02 | Discharge: 2019-10-02 | Disposition: A | Discharge status: Home / Self Care | Payer: 59 | Source: Ambulatory Visit | Attending: Hematology | Admitting: Hematology

## 2019-10-02 DIAGNOSIS — R1900 Intra-abdominal and pelvic swelling, mass and lump, unspecified site: Secondary | ICD-10-CM | POA: Diagnosis present

## 2019-10-02 MED ORDER — IOHEXOL 300 MG/ML  SOLN
100.0000 mL | Freq: Once | INTRAMUSCULAR | Status: AC | PRN
  Administered 2019-10-02: 100 mL via INTRAVENOUS

## 2019-10-02 MED ORDER — SODIUM CHLORIDE (PF) 0.9 % IJ SOLN
INTRAMUSCULAR | Status: AC
  Filled 2019-10-02: qty 50

## 2019-10-04 ENCOUNTER — Other Ambulatory Visit: Payer: Self-pay

## 2019-10-04 ENCOUNTER — Encounter: Payer: Self-pay | Admitting: Nurse Practitioner

## 2019-10-04 ENCOUNTER — Inpatient Hospital Stay: Payer: 59 | Attending: Hematology | Admitting: Nurse Practitioner

## 2019-10-04 VITALS — BP 134/80 | HR 71 | Temp 97.9°F | Resp 18 | Ht 65.5 in | Wt 218.9 lb

## 2019-10-04 DIAGNOSIS — R1909 Other intra-abdominal and pelvic swelling, mass and lump: Secondary | ICD-10-CM | POA: Insufficient documentation

## 2019-10-04 DIAGNOSIS — D649 Anemia, unspecified: Secondary | ICD-10-CM | POA: Insufficient documentation

## 2019-10-04 DIAGNOSIS — K6389 Other specified diseases of intestine: Secondary | ICD-10-CM | POA: Diagnosis not present

## 2019-10-04 DIAGNOSIS — N920 Excessive and frequent menstruation with regular cycle: Secondary | ICD-10-CM | POA: Insufficient documentation

## 2019-10-04 DIAGNOSIS — Z79899 Other long term (current) drug therapy: Secondary | ICD-10-CM | POA: Diagnosis not present

## 2019-10-04 NOTE — Progress Notes (Addendum)
Amy Frost   Telephone:(336) [phone] Fax:(336) [phone]   Clinic Follow up Note   Patient Care Team: Patient, No Pcp Per as PCP - General (General Practice) Jonnie Finner, RN as Oncology Nurse Navigator 10/04/2019  CHIEF COMPLAINT: Mesenteric mass  CURRENT THERAPY: Observation  INTERVAL HISTORY: Amy Frost returns for follow-up as scheduled.  Her last visit on 05/24/2019 with Dr. Burr Medico was virtual.  In the interim she underwent additional work-up including CA-125, CA 19.9, CEA, chromogranin A and urine 5 HIAA which were all negative/normal.  Upper endoscopy showed a 58 x 27 mm mass in the perigastric/peripancreatic region, no malignant appearing lymph nodes in the celiac, peripancreatic or porta hepatis region.  Cytology showed lymphoid tissue.  She had repeat CT AP with contrast on 10/02/2019 that showed no substantial interval change in the central mesenteric mass with adjacent 12 mm central abdominal node.  Amy Frost presents by herself today. She has a mass in her left back that is causing her to be out of alignment. Mobility and exercise are limited. She gets "knots" and spasms around the mass. This has been present at least 15 years but is growing lately.   She gets full quickly and has to eat small meals. Denies change in bowel habits or abdominal pain. She had chills and vomiting after CT contrast. No fever, sweats, weight loss. Denies cough, chest pain, dyspnea, bleeding.    MEDICAL HISTORY:  Past Medical History:  Diagnosis Date  . GERD (gastroesophageal reflux disease)   . Mesenteric mass 2021    SURGICAL HISTORY: Past Surgical History:  Procedure Laterality Date  . BIOPSY  06/18/2019   Procedure: BIOPSY;  Surgeon: Rush Landmark Telford Nab., MD;  Location: [place];  Service: Gastroenterology;;  . ESOPHAGOGASTRODUODENOSCOPY (EGD) WITH PROPOFOL N/A 06/18/2019   Procedure: ESOPHAGOGASTRODUODENOSCOPY (EGD) WITH PROPOFOL;  Surgeon: Irving Copas.,  MD;  Location: [place];  Service: Gastroenterology;  Laterality: N/A;  . FINE NEEDLE ASPIRATION  06/18/2019   Procedure: FINE NEEDLE ASPIRATION (FNA) LINEAR;  Surgeon: Irving Copas., MD;  Location: [place];  Service: Gastroenterology;;  . NO PAST SURGERIES    . UPPER ESOPHAGEAL ENDOSCOPIC ULTRASOUND (EUS) N/A 06/18/2019   Procedure: UPPER ESOPHAGEAL ENDOSCOPIC ULTRASOUND (EUS);  Surgeon: Irving Copas., MD;  Location: [place];  Service: Gastroenterology;  Laterality: N/A;  . UPPER GASTROINTESTINAL ENDOSCOPY     2021    I have reviewed the social history and family history with the patient and they are unchanged from previous note.  ALLERGIES:  has No Known Allergies.  MEDICATIONS:  Current Outpatient Medications  Medication Sig Dispense Refill  . omeprazole (PRILOSEC) 40 MG capsule Take 1 capsule (40 mg total) by mouth daily. 30 capsule 4   No current facility-administered medications for this visit.    PHYSICAL EXAMINATION:  Vitals:   10/04/19 1449  BP: (!) 134/80  Pulse: 71  Resp: 18  Temp: 97.9 F (36.6 C)  SpO2: 100%   Filed Weights   10/04/19 1449  Weight: (!) 218 lb 14.4 oz (99.3 kg)    GENERAL:alert, no distress and comfortable SKIN: no rash to exposed skin  EYES:  sclera clear LUNGS: clear with normal breathing effort HEART: regular rate & rhythm, no lower extremity edema ABDOMEN:abdomen soft, non-tender and normal bowel sounds Musculoskeletal: asymmetry of the left low back/flank with palpable soft tissue mass. No tenderness to palpation  NEURO: alert & oriented x 3 with fluent speech  LABORATORY DATA:  I have reviewed the data  as listed CBC Latest Ref Rng & Units 08/22/2019 06/20/2019 05/18/2019  WBC 4.0 - 10.5 K/uL 4.6 5.3 6.1  Hemoglobin 12.0 - 15.0 g/dL 11.4(L) 11.1(L) 11.7(L)  Hematocrit 36 - 46 % 36.8 34.7(L) 38.4  Platelets 150 - 400 K/uL 282 259.0 288     CMP Latest Ref Rng & Units 08/22/2019 06/20/2019 04/23/2019    Glucose 70 - 99 mg/dL 111(H) 120(H) 99  BUN 6 - 20 mg/dL 9 9 11   Creatinine 0.44 - 1.00 mg/dL 0.80 0.69 0.76  Sodium 135 - 145 mmol/L 142 139 139  Potassium 3.5 - 5.1 mmol/L 3.9 3.7 4.2  Chloride 98 - 111 mmol/L 107 102 103  CO2 22 - 32 mmol/L 25 29 25   Calcium 8.9 - 10.3 mg/dL 9.0 9.1 9.5  Total Protein 6.5 - 8.1 g/dL 7.5 7.1 7.8  Total Bilirubin 0.3 - 1.2 mg/dL 0.3 0.3 0.8  Alkaline Phos 38 - 126 U/L 68 65 70  AST 15 - 41 U/L 11(L) 14 35  ALT 0 - 44 U/L 11 17 33      RADIOGRAPHIC STUDIES: I have personally reviewed the radiological images as listed and agreed with the findings in the report. No results found.   ASSESSMENT & PLAN: 44 yo female with   1. Mesenteric Mass  -This is incidental finding.Her CT angio chest and CT AP from 04/23/19 showed 5.9cm mesenteric mass. It does not appear to be involving attached to her small bowel, could be a lymph node but no other adenopathy on CT -biopsy from 05/18/19 showed reactive lymphoid hyperplasia. The differential includes indolent lymphoma, neuroendocrine tumor, or metastatic disease is less likely  -on 05/30/19 Chromogranin A, 24-hour urine 5 HIAA, CEA, CA 19.9, CA125 all normal; LDH slightly elevated but non-specific  -06/18/19 repeat EGD shows esophagitis and a 58 x 27 mm mass in the perigastric/peripancreatic region, no malignant appearing lymph nodes in the celiac, peripancreatic or porta hepatis region.   -Cytology showed lymphoid tissue -CT AP with contrast on 10/02/2019 showed no substantial interval change in the central mesenteric mass with adjacent 12 mm central abdominal node -planning to repeat EGD in 10/2019 by Dr. Rush Landmark to ensure resolution of esophagitis. On PPI  2. Left back soft tissue mass, c/w large lipoma  3. Mild Anemia  -heavy menses every other month  -hgb 11 range since 04/2019  -stable, mild on multivitamin with iron      Disposition:  Ms. Droll is clinically doing well. She has early satiety from  the mesenteric mass, otherwise asymptomatic. We reviewed her CT AP which is stable from 04/2019. Multiple biopsy shows lymphoid tissue. This is likely benign, but an indolent lymphoma is not ruled out. No B symptoms. We discussed this would be difficult to surgically remove due to the location, she is interested in surgical opinion. We will review in our GI tumor board next week. If Dr. Barry Dienes will not offer surgery, we will consider referral to [facility] or [facility] depending on her insurance. Patient will find out which is in her network.   She has soft tissue mass on her left back consistent with large lipoma that is enlarging and causing pain. She would like a surgeon to consider removing this as well.   She is otherwise doing well, recent lab shows stable mild anemia. She is having hard time losing weight. She requests referral to weight loss management which I placed today.   Will call her after tumor board discussion. F/u in 3 months, or sooner  if needed.    Orders Placed This Encounter  Procedures  . Amb ref to Medical Nutrition Therapy-MNT    Referral Priority:   Routine    Referral Type:   Consultation    Referral Reason:   Specialty Services Required    Requested Specialty:   Nutrition    Number of Visits Requested:   1   All questions were answered. The patient knows to call the clinic with any problems, questions or concerns. No barriers to learning were detected.     Alla Feeling, NP 10/04/19   Addendum  I have seen the patient, examined her. I agree with the assessment and and plan and have edited the notes.   I reviewed her CT scan in person, her mesentery mass and a mild abdominal adenopathy is stable, no other new findings.  Patient is symptomatic with abdominal bloating after eating, likely related to the mesentery mass.  She has had 2 biopsies which were all negative.  However the concern of indolent lymphoma or low-grade malignancy remains to be high, I recommend surgical biopsy  or surgical resection if possible.  Will discuss with Dr. Barry Dienes next week, and may have to refer her to a tertiary surgical center if needed. She will check her insurance coverage. Will continue f/u her.  Truitt Merle  10/04/2019

## 2019-10-05 ENCOUNTER — Telehealth: Payer: Self-pay | Admitting: Nurse Practitioner

## 2019-10-05 NOTE — Telephone Encounter (Signed)
Called to confirm appts per 7/29 los - pt is aware of 3 month and 6 month appt.

## 2019-10-07 ENCOUNTER — Encounter: Payer: Self-pay | Admitting: Nurse Practitioner

## 2019-10-08 ENCOUNTER — Other Ambulatory Visit: Payer: Self-pay

## 2019-10-08 ENCOUNTER — Telehealth: Payer: Self-pay

## 2019-10-08 DIAGNOSIS — K6389 Other specified diseases of intestine: Secondary | ICD-10-CM

## 2019-10-08 DIAGNOSIS — R1900 Intra-abdominal and pelvic swelling, mass and lump, unspecified site: Secondary | ICD-10-CM

## 2019-10-08 NOTE — Telephone Encounter (Signed)
Received call from [facility].  They do not do weight management.  A referral needs to be placed for Dr[facility]

## 2019-10-10 ENCOUNTER — Ambulatory Visit (INDEPENDENT_AMBULATORY_CARE_PROVIDER_SITE_OTHER): Payer: 59

## 2019-10-10 ENCOUNTER — Telehealth: Payer: Self-pay | Admitting: Nurse Practitioner

## 2019-10-10 ENCOUNTER — Other Ambulatory Visit: Payer: Self-pay | Admitting: Gastroenterology

## 2019-10-10 ENCOUNTER — Other Ambulatory Visit: Payer: Self-pay

## 2019-10-10 DIAGNOSIS — Z1159 Encounter for screening for other viral diseases: Secondary | ICD-10-CM

## 2019-10-10 LAB — SARS CORONAVIRUS 2 (TAT 6-24 HRS): SARS Coronavirus 2: NEGATIVE

## 2019-10-10 NOTE — Telephone Encounter (Signed)
I called Ms. Guyton to review tumor board discussion. Due to the location and surrounding vascular structures, Dr. Barry Dienes is not recommending surgery on the mesenteric mass. We would monitor her and f/u with imaging in 6 months from now. She is still interested in second surgical opinion. She will see if [facility] or [facility] is in her insurance network and let me know, then I will refer her. She appreciates the call.   [name], NP

## 2019-10-12 ENCOUNTER — Encounter: Payer: Self-pay | Admitting: Gastroenterology

## 2019-10-12 ENCOUNTER — Other Ambulatory Visit: Payer: Self-pay

## 2019-10-12 ENCOUNTER — Ambulatory Visit (AMBULATORY_SURGERY_CENTER): Payer: 59 | Admitting: Gastroenterology

## 2019-10-12 VITALS — BP 116/76 | HR 67 | Temp 97.1°F | Resp 17 | Ht 65.0 in | Wt 220.2 lb

## 2019-10-12 DIAGNOSIS — K2970 Gastritis, unspecified, without bleeding: Secondary | ICD-10-CM

## 2019-10-12 DIAGNOSIS — K6389 Other specified diseases of intestine: Secondary | ICD-10-CM

## 2019-10-12 DIAGNOSIS — K222 Esophageal obstruction: Secondary | ICD-10-CM | POA: Diagnosis not present

## 2019-10-12 DIAGNOSIS — K219 Gastro-esophageal reflux disease without esophagitis: Secondary | ICD-10-CM

## 2019-10-12 DIAGNOSIS — K2100 Gastro-esophageal reflux disease with esophagitis, without bleeding: Secondary | ICD-10-CM | POA: Diagnosis not present

## 2019-10-12 MED ORDER — SODIUM CHLORIDE 0.9 % IV SOLN: 500.0000 mL | Freq: Once | INTRAVENOUS | Status: DC

## 2019-10-12 MED ORDER — OMEPRAZOLE 20 MG PO CPDR: 20.0000 mg | DELAYED_RELEASE_CAPSULE | Freq: Every day | ORAL | 3 refills | Status: DC

## 2019-10-12 NOTE — Progress Notes (Signed)
Called to room to assist during endoscopic procedure.  Patient ID and intended procedure confirmed with present staff. Received instructions for my participation in the procedure from the performing physician.  

## 2019-10-12 NOTE — Patient Instructions (Addendum)
Read all of the handouts given to you by your recovery room nurse.  Continue your medications . Dr. Janeal Holmes reviewed your sore throat. He stated to have soft food until the sore throat goes away.  Use chloreseptic spray per Dr Rush Landmark. If the pain gets worse, call us.  YOU HAD AN ENDOSCOPIC PROCEDURE TODAY AT [facility]:   Refer to the procedure report that was given to you for any specific questions about what was found during the examination.  If the procedure report does not answer your questions, please call your gastroenterologist to clarify.  If you requested that your care partner not be given the details of your procedure findings, then the procedure report has been included in a sealed envelope for you to review at your convenience later.  YOU SHOULD EXPECT: Some feelings of bloating in the abdomen. Passage of more gas than usual.  Walking can help get rid of the air that was put into your GI tract during the procedure and reduce the bloating.   Please Note:  You might notice some irritation and congestion in your nose or some drainage.  This is from the oxygen used during your procedure.  There is no need for concern and it should clear up in a day or so.  SYMPTOMS TO REPORT IMMEDIATELY:    Following upper endoscopy (EGD)  Vomiting of blood or coffee ground material  New chest pain or pain under the shoulder blades  Painful or persistently difficult swallowing  New shortness of breath  Fever of 100F or higher  Black, tarry-looking stools  For urgent or emergent issues, a gastroenterologist can be reached at any hour by calling [phone]. Do not use MyChart messaging for urgent concerns.    DIET:  We do recommend nothing by mouth until 11 am.  Clear liquids from 11 am to 12 pm.  Then proceed to a soft diet for the rest of today. Avoid alcoholic beverages for 24 hours.  ACTIVITY:  You should plan to take it easy for the rest of today and you should NOT  DRIVE or use heavy machinery until tomorrow (because of the sedation medicines used during the test).    FOLLOW UP: Our staff will call the number listed on your records 48-72 hours following your procedure to check on you and address any questions or concerns that you may have regarding the information given to you following your procedure. If we do not reach you, we will leave a message.  We will attempt to reach you two times.  During this call, we will ask if you have developed any symptoms of COVID 19. If you develop any symptoms (ie: fever, flu-like symptoms, shortness of breath, cough etc.) before then, please call [phone].  If you test positive for Covid 19 in the 2 weeks post procedure, please call and report this information to [place].    If any biopsies were taken you will be contacted by phone or by letter within the next 1-3 weeks.  Please call us at [phone] if you have not heard about the biopsies in 3 weeks.    SIGNATURES/CONFIDENTIALITY: You and/or your care partner have signed paperwork which will be entered into your electronic medical record.  These signatures attest to the fact that that the information above on your After Visit Summary has been reviewed and is understood.  Full responsibility of the confidentiality of this discharge information lies with you and/or your care-partner.

## 2019-10-12 NOTE — Progress Notes (Signed)
Pt's states no medical or surgical changes since previsit or office visit.  VS Robinson

## 2019-10-12 NOTE — Progress Notes (Signed)
A and O x3. Report to RN. Tolerated MAC anesthesia well.Teeth unchanged after procedure.

## 2019-10-12 NOTE — Op Note (Signed)
[place] Patient Name: Amy Frost Procedure Date: 10/12/2019 9:35 AM MRN: [id number] Endoscopist: Justice Britain , MD Age: 44 Referring MD:  Date of Birth: [birth year] Gender: Female Account #: [id number] Procedure:                Upper GI endoscopy Indications:              Esophagitis, Follow-up of esophagitis Medicines:                Monitored Anesthesia Care Procedure:                Pre-Anesthesia Assessment:                           - Prior to the procedure, a History and Physical                            was performed, and patient medications and                            allergies were reviewed. The patient's tolerance of                            previous anesthesia was also reviewed. The risks                            and benefits of the procedure and the sedation                            options and risks were discussed with the patient.                            All questions were answered, and informed consent                            was obtained. Prior Anticoagulants: The patient has                            taken no previous anticoagulant or antiplatelet                            agents. ASA Grade Assessment: II - A patient with                            mild systemic disease. After reviewing the risks                            and benefits, the patient was deemed in                            satisfactory condition to undergo the procedure.                           After obtaining informed consent, the endoscope was  passed under direct vision. Throughout the                            procedure, the patient's blood pressure, pulse, and                            oxygen saturations were monitored continuously. The                            Endoscope was introduced through the mouth, and                            advanced to the second part of duodenum. The upper                            GI endoscopy was  accomplished without difficulty.                            The patient tolerated the procedure. Scope In: Scope Out: Findings:                 No gross lesions were noted in the entire                            esophagus. Previous esophagitis has healed.                           A widely patent and non-obstructing Schatzki ring                            was found at the gastroesophageal junction.                            Biopsies were taken with a cold forceps for                            histology and to disrupt it. After the rest of the                            EGD was completed, a guidewire was placed and the                            scope was withdrawn. Dilation was performed with a                            Savary dilator with no resistance at 18 mm. The                            dilation site was examined following endoscope                            reinsertion and showed moderate mucosal disruption  of the ring.                           No gross lesions were noted in the entire examined                            stomach.                           No gross lesions were noted in the duodenal bulb,                            in the first portion of the duodenum and in the                            second portion of the duodenum. Complications:            No immediate complications. Estimated Blood Loss:     Estimated blood loss was minimal. Impression:               - No gross lesions in esophagus - previous                            esophagitis has healed. Widely patent and                            non-obstructing Schatzki ring. Disrupted. Dilated.                           - No gross lesions in the stomach.                           - No gross lesions in the duodenal bulb, in the                            first portion of the duodenum and in the second                            portion of the duodenum. Recommendation:           -  The patient will be observed post-procedure,                            until all discharge criteria are met.                           - Discharge patient to home.                           - Patient has a contact number available for                            emergencies. The signs and symptoms of potential                            delayed  complications were discussed with the                            patient. Return to normal activities tomorrow.                            Written discharge instructions were provided to the                            patient.                           - Dilation diet as per protocol.                           - Continue current PPI dosing. Can try to decrease                            in 1 month to 20 mg daily and maintain based on                            symptoms. If in future the patient wants to                            consider additional management options such as                            Fundoplication + Hiatal Hernia repair vs TIF vs                            [place].                           - As per [facility] discussion holding on repeat biopsy of                            the mesenteric lymphoid lesion and further                            management as per Oncology service.                           - Return to GI clinic in 6-8 weeks.                           - The findings and recommendations were discussed                            with the patient. Justice Britain, MD 10/12/2019 10:19:49 AM

## 2019-10-12 NOTE — Progress Notes (Signed)
Patient had a pain score of #5 in throat.  Dr Rush Landmark examined her, and he told her to use chloreseptic spray for her throat.  He explained that it was "normal" after dilation.  He told her to stay on a soft diet if the current pain persists.  She is to call us if the pain worsens.

## 2019-10-13 ENCOUNTER — Other Ambulatory Visit: Payer: Self-pay | Admitting: Gastroenterology

## 2019-10-16 ENCOUNTER — Telehealth: Payer: Self-pay

## 2019-10-16 NOTE — Telephone Encounter (Signed)
°  Follow up Call-  Call back number 10/12/2019  Post procedure Call Back phone  # [phone]  Permission to leave phone message Yes  Some recent data might be hidden     Patient questions:  Do you have a fever, pain , or abdominal swelling? No. Pain Score  0 *  Have you tolerated food without any problems? Yes.    Have you been able to return to your normal activities? Yes.    Do you have any questions about your discharge instructions: Diet   No. Medications  No. Follow up visit  No.  Do you have questions or concerns about your Care? No.  Actions: * If pain score is 4 or above: No action needed, pain <4.   1. Have you developed a fever since your procedure? No   2.   Have you had an respiratory symptoms (SOB or cough) since your procedure? No   3.   Have you tested positive for COVID 19 since your procedure? No   4.   Have you had any family members/close contacts diagnosed with the COVID 19 since your procedure?  No    If yes to any of these questions please route to Joylene John, RN and Joella Prince, RN

## 2019-10-16 NOTE — Telephone Encounter (Signed)
Left message on answering machine. 

## 2019-10-23 ENCOUNTER — Encounter (INDEPENDENT_AMBULATORY_CARE_PROVIDER_SITE_OTHER): Payer: Self-pay | Admitting: Family Medicine

## 2019-10-23 ENCOUNTER — Ambulatory Visit (INDEPENDENT_AMBULATORY_CARE_PROVIDER_SITE_OTHER): Payer: 59 | Admitting: Family Medicine

## 2019-10-23 ENCOUNTER — Other Ambulatory Visit: Payer: Self-pay

## 2019-10-23 VITALS — BP 127/84 | HR 63 | Temp 98.4°F | Ht 65.0 in | Wt 217.0 lb

## 2019-10-23 DIAGNOSIS — Z1331 Encounter for screening for depression: Secondary | ICD-10-CM | POA: Diagnosis not present

## 2019-10-23 DIAGNOSIS — K219 Gastro-esophageal reflux disease without esophagitis: Secondary | ICD-10-CM | POA: Diagnosis not present

## 2019-10-23 DIAGNOSIS — Z9189 Other specified personal risk factors, not elsewhere classified: Secondary | ICD-10-CM

## 2019-10-23 DIAGNOSIS — E559 Vitamin D deficiency, unspecified: Secondary | ICD-10-CM

## 2019-10-23 DIAGNOSIS — R0602 Shortness of breath: Secondary | ICD-10-CM

## 2019-10-23 DIAGNOSIS — Z6836 Body mass index (BMI) 36.0-36.9, adult: Secondary | ICD-10-CM

## 2019-10-23 DIAGNOSIS — R5383 Other fatigue: Secondary | ICD-10-CM

## 2019-10-23 DIAGNOSIS — M2550 Pain in unspecified joint: Secondary | ICD-10-CM | POA: Diagnosis not present

## 2019-10-23 DIAGNOSIS — K6389 Other specified diseases of intestine: Secondary | ICD-10-CM

## 2019-10-23 DIAGNOSIS — Z862 Personal history of diseases of the blood and blood-forming organs and certain disorders involving the immune mechanism: Secondary | ICD-10-CM

## 2019-10-23 DIAGNOSIS — G8929 Other chronic pain: Secondary | ICD-10-CM

## 2019-10-23 DIAGNOSIS — R7301 Impaired fasting glucose: Secondary | ICD-10-CM

## 2019-10-23 DIAGNOSIS — Z0289 Encounter for other administrative examinations: Secondary | ICD-10-CM

## 2019-10-23 NOTE — Progress Notes (Signed)
Dear Amy Rue, NP,   Thank you for referring Amy Frost to our clinic. The following note includes my evaluation and treatment recommendations.  Chief Complaint:   OBESITY Amy Frost (MR# [id number]) is a 44 y.o. female who presents for evaluation and treatment of obesity and related comorbidities. Current BMI is Body mass index is 36.11 kg/m. Amy Frost has been struggling with her weight for many years and has been unsuccessful in either losing weight, maintaining weight loss, or reaching her healthy weight goal.  Amy Frost is currently in the action stage of change and ready to dedicate time achieving and maintaining a healthier weight. Amy Frost is interested in becoming our patient and working on intensive lifestyle modifications including (but not limited to) diet and exercise for weight loss.  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  She lives with her domestic partner, Amy Frost, and her son, who is 34 years old.  She has been on a keto diet in the recent past.  She eats out 3-5 times per week.  She denies cravings.  No food dislikes.  She says she does not snack between meals.  Occasionally eats chips and dip or candy after dinner.  She "often skips meals" and considers this one of her worst habits.  Amy Frost's habits were reviewed today and are as follows: Her family eats meals together, she thinks her family will eat healthier with her, her desired weight loss is 54 pounds, she started gaining weight after childbirth in 2005, her heaviest weight ever was 240 pounds, she snacks frequently in the evenings, she skips meals frequently, she is frequently drinking liquids with calories and she struggles with emotional eating.  Depression Screen Amy Frost's Food and Mood (modified PHQ-9) score was 4.  Depression screen PHQ 2/9 10/23/2019  Decreased Interest 1  Down, Depressed, Hopeless 0  PHQ - 2 Score 1  Altered sleeping 1  Tired, decreased energy 1  Change in appetite 0  Feeling bad or failure  about yourself  0  Trouble concentrating 0  Moving slowly or fidgety/restless 1  Suicidal thoughts 0  PHQ-9 Score 4   Subjective:   1. Other fatigue Amy Frost denies daytime somnolence and reports waking up still tired. Patent has a history of symptoms of morning fatigue. Amy Frost generally gets 6 hours of sleep per night, and states that she has poor quality sleep. Snoring is not present. Apneic episodes are not present. Epworth Sleepiness Score is 6.  2. SOB (shortness of breath) on exertion Amy Frost notes increasing shortness of breath with exercising and seems to be worsening over time with weight gain. She notes getting out of breath sooner with activity than she used to. This has gotten worse recently. Amy Frost denies shortness of breath at rest or orthopnea.  3. Gastroesophageal reflux disease, unspecified whether esophagitis present She has been experiencing symptoms of GERD. She occasionally uses OTC Prilosec.  Symptoms are well-controlled currently.  Denies concerns but must watch what she eats or it will trigger.  4. Chronic joint pain Amy Frost has diffuse chronic arthralgias.    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  6. Vitamin D deficiency She is currently taking no vitamin D supplement.  7. History of iron deficiency anemia Amy Frost is not a vegetarian.  She does not have a history of weight loss surgery.   CBC Latest Ref Rng & Units 08/22/2019 06/20/2019 05/18/2019  WBC 4.0 - 10.5 K/uL 4.6 5.3 6.1  Hemoglobin 12.0 - 15.0 g/dL 11.4(L)  11.1(L) 11.7(L)  Hematocrit 36 - 46 % 36.8 34.7(L) 38.4  Platelets 150 - 400 K/uL 282 259.0 288   8. Elevated fasting glucose Amy Frost has a history of some elevated blood glucose readings without a diagnosis of diabetes. She denies polyphagia.  9. Depression screening Amy Frost was screened for depression as part of her new patient workup today.  10. At risk for dehydration Amy Frost is at risk for dehydration due to  inadequate water intake.  Assessment/Plan:   1. Other fatigue Amy Frost does feel that her weight is causing her energy to be lower than it should be. Fatigue may be related to obesity, depression or many other causes. Labs will be ordered, and in the meanwhile, Amy Frost will focus on self care including making healthy food choices, increasing physical activity and focusing on stress reduction. - EKG 12-Lead - Vitamin B12 - CBC with Differential/Platelet - Comprehensive metabolic panel - Folate - Hemoglobin A1c - Insulin, random - Lipid panel - T3 - T4 - TSH  2. SOB (shortness of breath) on exertion Amy Frost does feel that she gets out of breath more easily that she used to when she exercises. Amy Frost's shortness of breath appears to be obesity related and exercise induced. She has agreed to work on weight loss and gradually increase exercise to treat her exercise induced shortness of breath. Will continue to monitor closely. - CBC with Differential/Platelet - Hemoglobin A1c - Insulin, random - Lipid panel  3. Gastroesophageal reflux disease, unspecified whether esophagitis present Intensive lifestyle modifications are the first line treatment for this issue. We discussed several lifestyle modifications today and she will continue to work on diet, exercise and weight loss efforts. Orders and follow up as documented in patient record.   Counseling . If a person has gastroesophageal reflux disease (GERD), food and stomach acid move back up into the esophagus and cause symptoms or problems such as damage to the esophagus. . Anti-reflux measures include: raising the head of the bed, avoiding tight clothing or belts, avoiding eating late at night, not lying down shortly after mealtime, and achieving weight loss. . Avoid ASA, NSAID's, caffeine, alcohol, and tobacco.  . OTC Pepcid and/or Tums are often very helpful for as needed use.  Marland Kitchen However, for persisting chronic or daily symptoms, stronger  medications like Omeprazole may be needed. . You may need to avoid foods and drinks such as: ? Coffee and tea (with or without caffeine). ? Drinks that contain alcohol. ? Energy drinks and sports drinks. ? Bubbly (carbonated) drinks or sodas. ? Chocolate and cocoa. ? Peppermint and mint flavorings. ? Garlic and onions. ? Horseradish. ? Spicy and acidic foods. These include peppers, chili powder, curry powder, vinegar, hot sauces, and BBQ sauce. ? Citrus fruit juices and citrus fruits, such as oranges, lemons, and limes. ? Tomato-based foods. These include red sauce, chili, salsa, and pizza with red sauce. ? Fried and fatty foods. These include donuts, french fries, potato chips, and high-fat dressings. ? High-fat meats. These include hot dogs, rib eye steak, sausage, ham, and bacon.  4. Chronic joint pain Will continue to monitor.  5. Benign Mesenteric mass Followed by Oncology for this problem. Those encounter notes were reviewed.  6. Vitamin D deficiency Low Vitamin D level contributes to fatigue and are associated with obesity, breast, and colon cancer.  Check vitamin D level today.  - VITAMIN D 25 Hydroxy (Vit-D Deficiency, Fractures)  7. History of iron deficiency anemia Will check labs today.  Orders and follow up  as documented in patient record.  Counseling . Iron is essential for our bodies to make red blood cells.  Reasons that someone may be deficient include: an iron-deficient diet (more likely in those following vegan or vegetarian diets), women with heavy menses, patients with GI disorders or poor absorption, patients that have had bariatric surgery, frequent blood donors, patients with cancer, and patients with heart disease.   Marden Noble foods include dark leafy greens, red and white meats, eggs, seafood, and beans.   . Certain foods and drinks prevent your body from absorbing iron properly. Avoid eating these foods in the same meal as iron-rich foods or with iron  supplements. These foods include: coffee, black tea, and red wine; milk, dairy products, and foods that are high in calcium; beans and soybeans; whole grains.  . Constipation can be a side effect of iron supplementation. Increased water and fiber intake are helpful. Water goal: > 2 liters/day. Fiber goal: > 25 grams/day.  8. Elevated fasting glucose Fasting labs will be obtained and results with be discussed with Amy Frost in 2 weeks at her follow up visit. In the meanwhile Amy Frost was started on a lower simple carbohydrate diet and will work on weight loss efforts.  9. Depression screening Depression screen is negative.  10. At risk for dehydration Amy Frost was given approximately 15 minutes dehydration prevention counseling today. Amy Frost is at risk for dehydration due to weight loss and current medication(s). She was encouraged to hydrate and monitor fluid status to avoid dehydration as well as weight loss plateaus.   11. Class 2 severe obesity with serious comorbidity and body mass index (BMI) of 36.0 to 36.9 in adult, unspecified obesity type (HCC) Amy Frost is currently in the action stage of change and her goal is to continue with weight loss efforts. I recommend Amy Frost begin the structured treatment plan as follows:  She has agreed to the Category 2 Plan.  Exercise goals: As is.   Behavioral modification strategies: increasing lean protein intake, no skipping meals, meal planning and cooking strategies and planning for success.  She was informed of the importance of frequent follow-up visits to maximize her success with intensive lifestyle modifications for her multiple health conditions. She was informed we would discuss her lab results at her next visit unless there is a critical issue that needs to be addressed sooner. Amy Frost agreed to keep her next visit at the agreed upon time to discuss these results.  Objective:   Blood pressure 127/84, pulse 63, temperature 98.4 F (36.9 C), height 5'  5" (1.651 m), weight 217 lb (98.4 kg), last menstrual period 09/25/2019, SpO2 100 %. Body mass index is 36.11 kg/m.  EKG: Normal sinus rhythm, rate 64 bpm.  Indirect Calorimeter completed today shows a VO2 of 270 and a REE of 1876.  Her calculated basal metabolic rate is 3382 thus her basal metabolic rate is better than expected.  General: Cooperative, alert, well developed, in no acute distress. HEENT: Conjunctivae and lids unremarkable. Cardiovascular: Regular rhythm.  Lungs: Normal work of breathing. Neurologic: No focal deficits.   Lab Results  Component Value Date   CREATININE 0.80 08/22/2019   BUN 9 08/22/2019   NA 142 08/22/2019   K 3.9 08/22/2019   CL 107 08/22/2019   CO2 25 08/22/2019   Lab Results  Component Value Date   ALT 11 08/22/2019   AST 11 (L) 08/22/2019   ALKPHOS 68 08/22/2019   BILITOT 0.3 08/22/2019   Lab Results  Component Value Date  WBC 4.6 08/22/2019   HGB 11.4 (L) 08/22/2019   HCT 36.8 08/22/2019   MCV 82.3 08/22/2019   PLT 282 08/22/2019   Attestation Statements:   Reviewed by clinician on day of visit: allergies, medications, problem list, medical history, surgical history, family history, social history, and previous encounter notes.  I, Water quality scientist, CMA, am acting as Location manager for [employer], DO.  I have reviewed the above documentation for accuracy and completeness, and I agree with the above. Mellody Dance, DO

## 2019-10-24 LAB — COMPREHENSIVE METABOLIC PANEL
ALT: 13 IU/L (ref 0–32)
AST: 11 IU/L (ref 0–40)
Albumin/Globulin Ratio: 2 (ref 1.2–2.2)
Albumin: 5 g/dL — ABNORMAL HIGH (ref 3.8–4.8)
Alkaline Phosphatase: 83 IU/L (ref 48–121)
BUN/Creatinine Ratio: 13 (ref 9–23)
BUN: 9 mg/dL (ref 6–24)
Bilirubin Total: 0.3 mg/dL (ref 0.0–1.2)
CO2: 23 mmol/L (ref 20–29)
Calcium: 9.5 mg/dL (ref 8.7–10.2)
Chloride: 102 mmol/L (ref 96–106)
Creatinine, Ser: 0.68 mg/dL (ref 0.57–1.00)
GFR calc Af Amer: 123 mL/min/{1.73_m2} (ref >59)
GFR calc non Af Amer: 107 mL/min/{1.73_m2} (ref >59)
Globulin, Total: 2.5 g/dL (ref 1.5–4.5)
Glucose: 101 mg/dL — ABNORMAL HIGH (ref 65–99)
Potassium: 4.6 mmol/L (ref 3.5–5.2)
Sodium: 140 mmol/L (ref 134–144)
Total Protein: 7.5 g/dL (ref 6.0–8.5)

## 2019-10-24 LAB — CBC WITH DIFFERENTIAL/PLATELET
Basophils Absolute: 0 10*3/uL (ref 0.0–0.2)
Basos: 0 %
EOS (ABSOLUTE): 0.1 10*3/uL (ref 0.0–0.4)
Eos: 2 %
Hematocrit: 37.2 % (ref 34.0–46.6)
Hemoglobin: 11.7 g/dL (ref 11.1–15.9)
Immature Grans (Abs): 0 10*3/uL (ref 0.0–0.1)
Immature Granulocytes: 0 %
Lymphocytes Absolute: 1 10*3/uL (ref 0.7–3.1)
Lymphs: 20 %
MCH: 24.6 pg — ABNORMAL LOW (ref 26.6–33.0)
MCHC: 31.5 g/dL (ref 31.5–35.7)
MCV: 78 fL — ABNORMAL LOW (ref 79–97)
Monocytes Absolute: 0.3 10*3/uL (ref 0.1–0.9)
Monocytes: 7 %
Neutrophils Absolute: 3.6 10*3/uL (ref 1.4–7.0)
Neutrophils: 71 %
Platelets: 329 10*3/uL (ref 150–450)
RBC: 4.75 x10E6/uL (ref 3.77–5.28)
RDW: 15.4 % (ref 11.7–15.4)
WBC: 5.1 10*3/uL (ref 3.4–10.8)

## 2019-10-24 LAB — T4: T4, Total: 7.5 ug/dL (ref 4.5–12.0)

## 2019-10-24 LAB — HEMOGLOBIN A1C
Est. average glucose Bld gHb Est-mCnc: 126 mg/dL
Hgb A1c MFr Bld: 6 % — ABNORMAL HIGH (ref 4.8–5.6)

## 2019-10-24 LAB — VITAMIN B12: Vitamin B-12: 460 pg/mL (ref 232–1245)

## 2019-10-24 LAB — INSULIN, RANDOM: INSULIN: 20.7 u[IU]/mL (ref 2.6–24.9)

## 2019-10-24 LAB — VITAMIN D 25 HYDROXY (VIT D DEFICIENCY, FRACTURES): Vit D, 25-Hydroxy: 21.3 ng/mL — ABNORMAL LOW (ref 30.0–100.0)

## 2019-10-24 LAB — TSH: TSH: 1.63 u[IU]/mL (ref 0.450–4.500)

## 2019-10-24 LAB — LIPID PANEL
Chol/HDL Ratio: 4.2 ratio (ref 0.0–4.4)
Cholesterol, Total: 208 mg/dL — ABNORMAL HIGH (ref 100–199)
HDL: 50 mg/dL (ref >39)
LDL Chol Calc (NIH): 143 mg/dL — ABNORMAL HIGH (ref 0–99)
Triglycerides: 85 mg/dL (ref 0–149)
VLDL Cholesterol Cal: 15 mg/dL (ref 5–40)

## 2019-10-24 LAB — FOLATE: Folate: 12.8 ng/mL (ref >3.0)

## 2019-10-24 LAB — T3: T3, Total: 131 ng/dL (ref 71–180)

## 2019-10-28 ENCOUNTER — Encounter: Payer: Self-pay | Admitting: Gastroenterology

## 2019-11-06 ENCOUNTER — Encounter (INDEPENDENT_AMBULATORY_CARE_PROVIDER_SITE_OTHER): Payer: Self-pay | Admitting: Family Medicine

## 2019-11-06 ENCOUNTER — Ambulatory Visit (INDEPENDENT_AMBULATORY_CARE_PROVIDER_SITE_OTHER): Payer: 59 | Admitting: Family Medicine

## 2019-11-06 ENCOUNTER — Other Ambulatory Visit: Payer: Self-pay

## 2019-11-06 VITALS — BP 118/70 | HR 85 | Temp 98.1°F | Ht 65.0 in | Wt 216.0 lb

## 2019-11-06 DIAGNOSIS — E559 Vitamin D deficiency, unspecified: Secondary | ICD-10-CM | POA: Diagnosis not present

## 2019-11-06 DIAGNOSIS — Z9189 Other specified personal risk factors, not elsewhere classified: Secondary | ICD-10-CM | POA: Diagnosis not present

## 2019-11-06 DIAGNOSIS — R7303 Prediabetes: Secondary | ICD-10-CM | POA: Diagnosis not present

## 2019-11-06 DIAGNOSIS — K219 Gastro-esophageal reflux disease without esophagitis: Secondary | ICD-10-CM

## 2019-11-06 DIAGNOSIS — D508 Other iron deficiency anemias: Secondary | ICD-10-CM | POA: Diagnosis not present

## 2019-11-06 DIAGNOSIS — E785 Hyperlipidemia, unspecified: Secondary | ICD-10-CM

## 2019-11-06 DIAGNOSIS — Z6836 Body mass index (BMI) 36.0-36.9, adult: Secondary | ICD-10-CM

## 2019-11-06 MED ORDER — VITAMIN D (ERGOCALCIFEROL) 1.25 MG (50000 UNIT) PO CAPS: 50000.0000 [IU] | ORAL_CAPSULE | ORAL | 0 refills | Status: AC

## 2019-11-06 MED ORDER — FERROUS SULFATE 325 (65 FE) MG PO TBEC: 325.0000 mg | DELAYED_RELEASE_TABLET | Freq: Two times a day (BID) | ORAL | 0 refills | Status: AC

## 2019-11-06 NOTE — Progress Notes (Signed)
Chief Complaint:   OBESITY Amy Frost is here to discuss her progress with her obesity treatment plan along with follow-up of her obesity related diagnoses. Amy Frost is on the Category 2 Plan and states she is following her eating plan approximately 70-85% of the time. Amy Frost states she is doing Zumba for 60 minutes 2 times per week.  Today's visit was #: 2 Starting weight: 217 lbs Starting date: 10/23/2019 Today's weight: 216 lbs Today's date: 11/06/2019 Total lbs lost to date: 1 lb Total lbs lost since last in-office visit: 1 lb  Interim History:  This is Amy Frost's first follow-up visit.  She thinks it is a lot of meat.  She used to be vegan many years ago, but did not realize how much protein was needed.  She declines to convert to vegan, but will look at "meatless protein" options.  She has a very difficult time getting in calories.   Subjective:   1. Prediabetes Amy Frost has a diagnosis of prediabetes based on her elevated HgA1c and was informed this puts her at greater risk of developing diabetes. She continues to work on diet and exercise to decrease her risk of diabetes. She denies nausea or hypoglycemia.  She has elevated fasting insulin and an A1c of 6.0.  Lab Results  Component Value Date   HGBA1C 6.0 (H) 10/23/2019   Lab Results  Component Value Date   INSULIN 20.7 10/23/2019   2. Other iron deficiency anemia Amy Frost is not a vegetarian.  She does not have a history of weight loss surgery.  She is not taking a supplement at all.  She endorses fatigue.  CBC Latest Ref Rng & Units 10/23/2019 08/22/2019 06/20/2019  WBC 3.4 - 10.8 x10E3/uL 5.1 4.6 5.3  Hemoglobin 11.1 - 15.9 g/dL 11.7 11.4(L) 11.1(L)  Hematocrit 34.0 - 46.6 % 37.2 36.8 34.7(L)  Platelets 150 - 450 x10E3/uL 329 282 259.0   Lab Results  Component Value Date   VITAMINB12 460 10/23/2019   3. Gastroesophageal reflux disease, unspecified whether esophagitis present Amy Frost is taking omeprazole and symptoms are  well-controlled.  4. Vitamin D deficiency Amy Frost's Vitamin D level was 21.3 on 10/23/2019. She is currently taking no vitamin D supplement.   5. Other hyperlipidemia Amy Frost has hyperlipidemia and has been trying to improve her cholesterol levels with intensive lifestyle modification including a low saturated fat diet, exercise and weight loss. She denies any chest pain, claudication or myalgias.  Elevated LDL, HDL is normal.  Lab Results  Component Value Date   ALT 13 10/23/2019   AST 11 10/23/2019   ALKPHOS 83 10/23/2019   BILITOT 0.3 10/23/2019   Lab Results  Component Value Date   CHOL 208 (H) 10/23/2019   HDL 50 10/23/2019   LDLCALC 143 (H) 10/23/2019   TRIG 85 10/23/2019   CHOLHDL 4.2 10/23/2019   6. At risk for diabetes mellitus Amy Frost is at higher than average risk for developing diabetes due to her obesity.   Assessment/Plan:   1. Prediabetes New.  Discussed labs with patient today.  Kyllie will continue to work on weight loss, exercise, and decreasing simple carbohydrates to help decrease the risk of diabetes.  -  Recheck labs in 3 months.  - Extensive education provided.   - Prudent nutritional plan and weight loss.  2. Other iron deficiency anemia Discussed labs with patient today.  Orders and follow up as documented in patient record.  -->  Will start iron supplement 325 mg twice daily, as per below.  Recheck labs in 3 months.  Counseling . Iron is essential for our bodies to make red blood cells.  Reasons that someone may be deficient include: an iron-deficient diet (more likely in those following vegan or vegetarian diets), women with heavy menses, patients with GI disorders or poor absorption, patients that have had bariatric surgery, frequent blood donors, patients with cancer, and patients with heart disease.   Amy Frost foods include dark leafy greens, red and white meats, eggs, seafood, and beans.   . Certain foods and drinks prevent your body from  absorbing iron properly. Avoid eating these foods in the same meal as iron-rich foods or with iron supplements. These foods include: coffee, black tea, and red wine; milk, dairy products, and foods that are high in calcium; beans and soybeans; whole grains.  . Constipation can be a side effect of iron supplementation. Increased water and fiber intake are helpful. Water goal: > 2 liters/day. Fiber goal: > 25 grams/day.  -Start ferrous sulfate 325 (65 FE) MG tablet; Take 1 tablet (325 mg total) by mouth 2 (two) times daily with a meal.  Dispense: 60 tablet; Refill: 0  3. Gastroesophageal reflux disease, unspecified whether esophagitis present Discussed labs with patient today.  Intensive lifestyle modifications are the first line treatment for this issue. We discussed several lifestyle modifications today and she will continue to work on diet, exercise and weight loss efforts. Orders and follow up as documented in patient record.  Continue current treatment plan with Prilosec.  Prudent nutritional plan and weight loss.  Counseling . If a person has gastroesophageal reflux disease (GERD), food and stomach acid move back up into the esophagus and cause symptoms or problems such as damage to the esophagus. . Anti-reflux measures include: raising the head of the bed, avoiding tight clothing or belts, avoiding eating late at night, not lying down shortly after mealtime, and achieving weight loss. . Avoid ASA, NSAID's, caffeine, alcohol, and tobacco.  . OTC Pepcid and/or Tums are often very helpful for as needed use.  Marland Kitchen However, for persisting chronic or daily symptoms, stronger medications like Omeprazole may be needed. . You may need to avoid foods and drinks such as: ? Coffee and tea (with or without caffeine). ? Drinks that contain alcohol. ? Energy drinks and sports drinks. ? Bubbly (carbonated) drinks or sodas. ? Chocolate and cocoa. ? Peppermint and mint flavorings. ? Garlic and  onions. ? Horseradish. ? Spicy and acidic foods. These include peppers, chili powder, curry powder, vinegar, hot sauces, and BBQ sauce. ? Citrus fruit juices and citrus fruits, such as oranges, lemons, and limes. ? Tomato-based foods. These include red sauce, chili, salsa, and pizza with red sauce. ? Fried and fatty foods. These include donuts, french fries, potato chips, and high-fat dressings. ? High-fat meats. These include hot dogs, rib eye steak, sausage, ham, and bacon.  4. Vitamin D deficiency Worsening.  Discussed labs with patient today.  Low Vitamin D level contributes to fatigue and are associated with obesity, breast, and colon cancer. She agrees to continue to take prescription Vitamin D @50 ,000 IU every week and will follow-up for routine testing of Vitamin D, at least 2-3 times per year to avoid over-replacement.  Will recheck vitamin D level in 3 months.  Will closely monitor.  Prudent nutritional plan and weight loss.  -Start Vitamin D, Ergocalciferol, (DRISDOL) 1.25 MG (50000 UNIT) CAPS capsule; Take 1 capsule (50,000 Units total) by mouth every 7 (seven) days.  Dispense: 4 capsule; Refill: 0  5. Other hyperlipidemia New.  Discussed labs with patient today.  Cardiovascular risk and specific lipid/LDL goals reviewed.  We discussed several lifestyle modifications today and Melannie will continue to work on diet, exercise and weight loss efforts. Orders and follow up as documented in patient record.  Extensive education provided.  Prudent nutritional plan and weight loss.  Eventually increase exercise. The 10-year ASCVD risk score Amy Bussing DC Brooke Bonito., Amy al., Amy Frost) is: 0.8%   Values used to calculate the score:     Age: 44 years     Sex: Female     Is Non-Hispanic African American: Yes     Diabetic: No     Tobacco smoker: No     Systolic Blood Pressure: 774 mmHg     Is BP treated: No     HDL Cholesterol: 50 mg/dL     Total Cholesterol: 208 mg/dL    -Recheck labs in 3  months.  Counseling Intensive lifestyle modifications are the first line treatment for this issue. . Dietary changes: Increase soluble fiber. Decrease simple carbohydrates. . Exercise changes: Moderate to vigorous-intensity aerobic activity 150 minutes per week if tolerated. . Lipid-lowering medications: see documented in medical record.  6. At risk for heart disease Due to Amy Frost's current state of health and medical condition(s), they are at a significantly higher risk for heart disease.   This puts the patient at much greater risk to subsequently develop cardiopulmonary conditions that can significantly affect patient's quality of life in a negative manner as well.  At least 25+ minutes was spent on counseling Amy Frost about these concerns today and I stressed the importance of reversing risks factors of obesity, esp truncal and visceral fat, hypertension, hyperlipidemia, pre- and diabetes when applicable.   Initial goal is to lose at least 5-10% of starting weight to help reduce these risk factors.   Counseling: Intensive lifestyle modifications discussed with Amy Frost as most appropriate first line treatment.  she will continue to work on diet, exercise and weight loss efforts.  We will continue to reassess these conditions on a fairly regular basis in an attempt to decrease patient's overall morbidity and mortality.  Evidence-based interventions for health behavior change were utilized today including the discussion of self monitoring techniques, problem-solving barriers and SMART goal setting techniques.  Specifically regarding patient's less desirable eating habits and patterns, we employed the technique of small changes when Gerry has not been able to fully commit to her prudent nutritional plan.   7. Class 2 severe obesity with serious comorbidity and body mass index (BMI) of 36.0 to 36.9 in adult, unspecified obesity type (HCC) Amy Frost is currently in the action stage of change. As such, her  goal is to continue with weight loss efforts. She has agreed to the Category 2 Plan with meatless protein options.   Exercise goals: As is.  Behavioral modification strategies: increasing lean protein intake, no skipping meals, meal planning and cooking strategies, keeping healthy foods in the home and planning for success.  Gracen has agreed to follow-up with our clinic in 2 weeks. She was informed of the importance of frequent follow-up visits to maximize her success with intensive lifestyle modifications for her multiple health conditions.   Objective:   Blood pressure 118/70, pulse 85, temperature 98.1 F (36.7 C), height 5\' 5"  (1.651 m), weight 216 lb (98 kg), SpO2 98 %. Body mass index is 35.94 kg/m.  General: Cooperative, alert, well developed, in no acute distress. HEENT: Conjunctivae and lids unremarkable. Cardiovascular: Regular rhythm.  Lungs: Normal work of breathing. Neurologic: No focal deficits.   Lab Results  Component Value Date   CREATININE 0.68 10/23/2019   BUN 9 10/23/2019   NA 140 10/23/2019   K 4.6 10/23/2019   CL 102 10/23/2019   CO2 23 10/23/2019   Lab Results  Component Value Date   ALT 13 10/23/2019   AST 11 10/23/2019   ALKPHOS 83 10/23/2019   BILITOT 0.3 10/23/2019   Lab Results  Component Value Date   HGBA1C 6.0 (H) 10/23/2019   Lab Results  Component Value Date   INSULIN 20.7 10/23/2019   Lab Results  Component Value Date   TSH 1.630 10/23/2019   Lab Results  Component Value Date   CHOL 208 (H) 10/23/2019   HDL 50 10/23/2019   LDLCALC 143 (H) 10/23/2019   TRIG 85 10/23/2019   CHOLHDL 4.2 10/23/2019   Lab Results  Component Value Date   WBC 5.1 10/23/2019   HGB 11.7 10/23/2019   HCT 37.2 10/23/2019   MCV 78 (L) 10/23/2019   PLT 329 10/23/2019   Attestation Statements:   Reviewed by clinician on day of visit: allergies, medications, problem list, medical history, surgical history, family history, social history, and  previous encounter notes.  I, Water quality scientist, CMA, am acting as Location manager for [employer], DO.  I have reviewed the above documentation for accuracy and completeness, and I agree with the above. Mellody Dance, DO

## 2019-11-06 NOTE — Patient Instructions (Signed)
The 10-year ASCVD risk score Mikey Bussing DC Brooke Bonito., et al., 2013) is: 0.8%   Values used to calculate the score:     Age: 44 years     Sex: Female     Is Non-Hispanic African American: Yes     Diabetic: No     Tobacco smoker: No     Systolic Blood Pressure: 395 mmHg     Is BP treated: No     HDL Cholesterol: 50 mg/dL     Total Cholesterol: 208 mg/dL

## 2019-11-07 MED ORDER — VITAMIN D (ERGOCALCIFEROL) 1.25 MG (50000 UNIT) PO CAPS: 50000.0000 [IU] | ORAL_CAPSULE | ORAL | 0 refills | Status: AC

## 2019-11-07 MED ORDER — FERROUS SULFATE 325 (65 FE) MG PO TABS: 325.0000 mg | ORAL_TABLET | Freq: Two times a day (BID) | ORAL | 0 refills | Status: AC

## 2019-11-12 DIAGNOSIS — R7303 Prediabetes: Secondary | ICD-10-CM | POA: Insufficient documentation

## 2019-11-12 DIAGNOSIS — Z9189 Other specified personal risk factors, not elsewhere classified: Secondary | ICD-10-CM | POA: Insufficient documentation

## 2019-11-12 DIAGNOSIS — Z6836 Body mass index (BMI) 36.0-36.9, adult: Secondary | ICD-10-CM | POA: Insufficient documentation

## 2019-11-12 DIAGNOSIS — E559 Vitamin D deficiency, unspecified: Secondary | ICD-10-CM | POA: Insufficient documentation

## 2019-11-12 DIAGNOSIS — E785 Hyperlipidemia, unspecified: Secondary | ICD-10-CM | POA: Insufficient documentation

## 2019-11-12 DIAGNOSIS — D649 Anemia, unspecified: Secondary | ICD-10-CM | POA: Insufficient documentation

## 2020-01-01 ENCOUNTER — Other Ambulatory Visit: Payer: Self-pay | Admitting: Nurse Practitioner

## 2020-01-01 DIAGNOSIS — K6389 Other specified diseases of intestine: Secondary | ICD-10-CM

## 2020-01-01 NOTE — Progress Notes (Deleted)
[facility]   Telephone:([phone] Fax:(336) [phone]   Clinic Follow up Note   Patient Care Team: Patient, No Pcp Per as PCP - General (General Practice) Jonnie Finner, RN as Oncology Nurse Navigator 01/01/2020  CHIEF COMPLAINT: Follow-up mesenteric mass  CURRENT THERAPY: Observation  INTERVAL HISTORY: [name] returns for follow-up as scheduled.  She was last seen by [place] on 10/04/2019.   REVIEW OF SYSTEMS:   Constitutional: Denies fevers, chills or abnormal weight loss Eyes: Denies blurriness of vision Ears, nose, mouth, throat, and face: Denies mucositis or sore throat Respiratory: Denies cough, dyspnea or wheezes Cardiovascular: Denies palpitation, chest discomfort or lower extremity swelling Gastrointestinal:  Denies nausea, heartburn or change in bowel habits Skin: Denies abnormal skin rashes Lymphatics: Denies new lymphadenopathy or easy bruising Neurological:Denies numbness, tingling or new weaknesses Behavioral/Psych: Mood is stable, no new changes  All other systems were reviewed with the patient and are negative.  MEDICAL HISTORY:  Past Medical History:  Diagnosis Date  . Back pain   . GERD (gastroesophageal reflux disease)   . Mesenteric mass 2021    SURGICAL HISTORY: Past Surgical History:  Procedure Laterality Date  . BIOPSY  06/18/2019   Procedure: BIOPSY;  Surgeon: Rush Landmark Telford Nab., MD;  Location: [facility];  Service: Gastroenterology;;  . ESOPHAGOGASTRODUODENOSCOPY (EGD) WITH PROPOFOL N/A 06/18/2019   Procedure: ESOPHAGOGASTRODUODENOSCOPY (EGD) WITH PROPOFOL;  Surgeon: Irving Copas., MD;  Location: [facility];  Service: Gastroenterology;  Laterality: N/A;  . FINE NEEDLE ASPIRATION  06/18/2019   Procedure: FINE NEEDLE ASPIRATION (FNA) LINEAR;  Surgeon: Irving Copas., MD;  Location: [facility];  Service: Gastroenterology;;  . NO PAST SURGERIES    . UPPER ESOPHAGEAL ENDOSCOPIC ULTRASOUND (EUS) N/A  06/18/2019   Procedure: UPPER ESOPHAGEAL ENDOSCOPIC ULTRASOUND (EUS);  Surgeon: Irving Copas., MD;  Location: [facility];  Service: Gastroenterology;  Laterality: N/A;  . UPPER GASTROINTESTINAL ENDOSCOPY     2021    I have reviewed the social history and family history with the patient and they are unchanged from previous note.  ALLERGIES:  has No Known Allergies.  MEDICATIONS:  Current Outpatient Medications  Medication Sig Dispense Refill  . ferrous sulfate 325 (65 FE) MG EC tablet Take 1 tablet (325 mg total) by mouth in the morning and at bedtime. 60 tablet 0  . ferrous sulfate 325 (65 FE) MG tablet Take 1 tablet (325 mg total) by mouth 2 (two) times daily with a meal. 60 tablet 0  . omeprazole (PRILOSEC) 40 MG capsule TAKE 1 CAPSULE BY MOUTH EVERY DAY 90 capsule 1  . Vitamin D, Ergocalciferol, (DRISDOL) 1.25 MG (50000 UNIT) CAPS capsule Take 1 capsule (50,000 Units total) by mouth every 7 (seven) days. 4 capsule 0  . Vitamin D, Ergocalciferol, (DRISDOL) 1.25 MG (50000 UNIT) CAPS capsule Take 1 capsule (50,000 Units total) by mouth every 7 (seven) days. 4 capsule 0   No current facility-administered medications for this visit.    PHYSICAL EXAMINATION: ECOG PERFORMANCE STATUS: {CHL ONC ECOG PS:[id number]}  There were no vitals filed for this visit. There were no vitals filed for this visit.  GENERAL:alert, no distress and comfortable SKIN: skin color, texture, turgor are normal, no rashes or significant lesions EYES: normal, Conjunctiva are pink and non-injected, sclera clear OROPHARYNX:no exudate, no erythema and lips, buccal mucosa, and tongue normal  NECK: supple, thyroid normal size, non-tender, without nodularity LYMPH:  no palpable lymphadenopathy in the cervical, axillary or inguinal LUNGS: clear to auscultation and percussion  with normal breathing effort HEART: regular rate & rhythm and no murmurs and no lower extremity edema ABDOMEN:abdomen soft, non-tender  and normal bowel sounds Musculoskeletal:no cyanosis of digits and no clubbing  NEURO: alert & oriented x 3 with fluent speech, no focal motor/sensory deficits  LABORATORY DATA:  I have reviewed the data as listed CBC Latest Ref Rng & Units 10/23/2019 08/22/2019 06/20/2019  WBC 3.4 - 10.8 x10E3/uL 5.1 4.6 5.3  Hemoglobin 11.1 - 15.9 g/dL 11.7 11.4(L) 11.1(L)  Hematocrit 34.0 - 46.6 % 37.2 36.8 34.7(L)  Platelets 150 - 450 x10E3/uL 329 282 259.0     CMP Latest Ref Rng & Units 10/23/2019 08/22/2019 06/20/2019  Glucose 65 - 99 mg/dL 101(H) 111(H) 120(H)  BUN 6 - 24 mg/dL 9 9 9   Creatinine 0.57 - 1.00 mg/dL 0.68 0.80 0.69  Sodium 134 - 144 mmol/L 140 142 139  Potassium 3.5 - 5.2 mmol/L 4.6 3.9 3.7  Chloride 96 - 106 mmol/L 102 107 102  CO2 20 - 29 mmol/L 23 25 29   Calcium 8.7 - 10.2 mg/dL 9.5 9.0 9.1  Total Protein 6.0 - 8.5 g/dL 7.5 7.5 7.1  Total Bilirubin 0.0 - 1.2 mg/dL 0.3 0.3 0.3  Alkaline Phos 48 - 121 IU/L 83 68 65  AST 0 - 40 IU/L 11 11(L) 14  ALT 0 - 32 IU/L 13 11 17       RADIOGRAPHIC STUDIES: I have personally reviewed the radiological images as listed and agreed with the findings in the report. No results found.   ASSESSMENT & PLAN:  No problem-specific Assessment & Plan notes found for this encounter.   No orders of the defined types were placed in this encounter.  All questions were answered. The patient knows to call the clinic with any problems, questions or concerns. No barriers to learning was detected. I spent {CHL ONC TIME VISIT - LSLHT:[id number]} counseling the patient face to face. The total time spent in the appointment was {CHL ONC TIME VISIT - BWIOM:[id number]} and more than 50% was on counseling and review of test results     Alla Feeling, NP 01/01/20

## 2020-01-03 ENCOUNTER — Inpatient Hospital Stay: Payer: 59 | Attending: Hematology | Admitting: Nurse Practitioner

## 2020-01-03 ENCOUNTER — Inpatient Hospital Stay: Payer: 59

## 2020-01-04 ENCOUNTER — Telehealth: Payer: Self-pay | Admitting: Nurse Practitioner

## 2020-01-04 NOTE — Telephone Encounter (Signed)
Called pt per 10/28 sch msg - no answer. Left message for patient to call back and reschedule appt.

## 2020-04-02 NOTE — Progress Notes (Incomplete)
[place]   [phone] [phone]   Clinic Follow up Note   Patient Care Team: Patient, No Pcp Per as PCP - General (General Practice)  Date of Service:  04/02/2020  CHIEF COMPLAINT: F/u of Mesenteric Mass   CURRENT THERAPY:  Observation  INTERVAL HISTORY: *** Amy Frost is here for a follow up. She was last seen by me 11 months ago and seen by NP Lacie 6 months ago in interim. She presents to the clinic alone.    REVIEW OF SYSTEMS:  *** Constitutional: Denies fevers, chills or abnormal weight loss Eyes: Denies blurriness of vision Ears, nose, mouth, throat, and face: Denies mucositis or sore throat Respiratory: Denies cough, dyspnea or wheezes Cardiovascular: Denies palpitation, chest discomfort or lower extremity swelling Gastrointestinal:  Denies nausea, heartburn or change in bowel habits Skin: Denies abnormal skin rashes Lymphatics: Denies new lymphadenopathy or easy bruising Neurological:Denies numbness, tingling or new weaknesses Behavioral/Psych: Mood is stable, no new changes  All other systems were reviewed with the patient and are negative.  MEDICAL HISTORY:  Past Medical History:  Diagnosis Date  . Back pain   . GERD (gastroesophageal reflux disease)   . Mesenteric mass 2021    SURGICAL HISTORY: Past Surgical History:  Procedure Laterality Date  . BIOPSY  06/18/2019   Procedure: BIOPSY;  Surgeon: Rush Landmark Telford Nab., MD;  Location: [place];  Service: Gastroenterology;;  . ESOPHAGOGASTRODUODENOSCOPY (EGD) WITH PROPOFOL N/A 06/18/2019   Procedure: ESOPHAGOGASTRODUODENOSCOPY (EGD) WITH PROPOFOL;  Surgeon: Irving Copas., MD;  Location: [place];  Service: Gastroenterology;  Laterality: N/A;  . FINE NEEDLE ASPIRATION  06/18/2019   Procedure: FINE NEEDLE ASPIRATION (FNA) LINEAR;  Surgeon: Irving Copas., MD;  Location: [place];  Service: Gastroenterology;;  . NO PAST SURGERIES    .  UPPER ESOPHAGEAL ENDOSCOPIC ULTRASOUND (EUS) N/A 06/18/2019   Procedure: UPPER ESOPHAGEAL ENDOSCOPIC ULTRASOUND (EUS);  Surgeon: Irving Copas., MD;  Location: [place];  Service: Gastroenterology;  Laterality: N/A;  . UPPER GASTROINTESTINAL ENDOSCOPY     2021    I have reviewed the social history and family history with the patient and they are unchanged from previous note.  ALLERGIES:  has No Known Allergies.  MEDICATIONS:  Current Outpatient Medications  Medication Sig Dispense Refill  . ferrous sulfate 325 (65 FE) MG EC tablet Take 1 tablet (325 mg total) by mouth in the morning and at bedtime. 60 tablet 0  . ferrous sulfate 325 (65 FE) MG tablet Take 1 tablet (325 mg total) by mouth 2 (two) times daily with a meal. 60 tablet 0  . omeprazole (PRILOSEC) 40 MG capsule TAKE 1 CAPSULE BY MOUTH EVERY DAY 90 capsule 1  . Vitamin D, Ergocalciferol, (DRISDOL) 1.25 MG (50000 UNIT) CAPS capsule Take 1 capsule (50,000 Units total) by mouth every 7 (seven) days. 4 capsule 0  . Vitamin D, Ergocalciferol, (DRISDOL) 1.25 MG (50000 UNIT) CAPS capsule Take 1 capsule (50,000 Units total) by mouth every 7 (seven) days. 4 capsule 0   No current facility-administered medications for this visit.    PHYSICAL EXAMINATION: ECOG PERFORMANCE STATUS: {CHL ONC ECOG [id number]}  There were no vitals filed for this visit. There were no vitals filed for this visit. *** GENERAL:alert, no distress and comfortable SKIN: skin color, texture, turgor are normal, no rashes or significant lesions EYES: normal, Conjunctiva are pink and non-injected, sclera clear {OROPHARYNX:no exudate, no erythema and lips, buccal mucosa, and tongue normal}  NECK: supple, thyroid normal size, non-tender,  without nodularity LYMPH:  no palpable lymphadenopathy in the cervical, axillary {or inguinal} LUNGS: clear to auscultation and percussion with normal breathing effort HEART: regular rate & rhythm and no murmurs and no  lower extremity edema ABDOMEN:abdomen soft, non-tender and normal bowel sounds Musculoskeletal:no cyanosis of digits and no clubbing  NEURO: alert & oriented x 3 with fluent speech, no focal motor/sensory deficits  LABORATORY DATA:  I have reviewed the data as listed CBC Latest Ref Rng & Units 10/23/2019 08/22/2019 06/20/2019  WBC 3.4 - 10.8 x10E3/uL 5.1 4.6 5.3  Hemoglobin 11.1 - 15.9 g/dL 11.7 11.4(L) 11.1(L)  Hematocrit 34.0 - 46.6 % 37.2 36.8 34.7(L)  Platelets 150 - 450 x10E3/uL 329 282 259.0     CMP Latest Ref Rng & Units 10/23/2019 08/22/2019 06/20/2019  Glucose 65 - 99 mg/dL 101(H) 111(H) 120(H)  BUN 6 - 24 mg/dL 9 9 9   Creatinine 0.57 - 1.00 mg/dL 0.68 0.80 0.69  Sodium 134 - 144 mmol/L 140 142 139  Potassium 3.5 - 5.2 mmol/L 4.6 3.9 3.7  Chloride 96 - 106 mmol/L 102 107 102  CO2 20 - 29 mmol/L 23 25 29   Calcium 8.7 - 10.2 mg/dL 9.5 9.0 9.1  Total Protein 6.0 - 8.5 g/dL 7.5 7.5 7.1  Total Bilirubin 0.0 - 1.2 mg/dL 0.3 0.3 0.3  Alkaline Phos 48 - 121 IU/L 83 68 65  AST 0 - 40 IU/L 11 11(L) 14  ALT 0 - 32 IU/L 13 11 17     CT Angio Chest 04/23/19 IMPRESSION: 1. Limited study for the detection of pulmonary emboli as detailed above. Given this limitation, no acute pulmonary embolism was detected. 2. There is a 5.2 x 3 cm hyperenhancing mass in the abdomen as detailed above. Differential considerations include but are not limited to a desmoid or carcinoid tumor. 3. Indeterminate 3 cm mass in the right hepatic lobe. Follow-up with a nonemergent contrast-enhanced MRI is recommended.    CT AP W contrast 04/23/19 IMPRESSION: 1. No acute findings identified within the abdomen or pelvis. 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 This is of uncertain etiology. Differential considerations include desmoid tumor, carcinoid tumor, GIST, lymphoma, or metastatic adenopathy. 2. There is a low-density mass with peripheral enhancement within the dome of liver which is favored to  represent a benign abnormality such as hemangioma. This could be confirmed with nonemergent contrast enhanced liver protocol MRI.   CT Biopsy 05/18/19 FINAL MICROSCOPIC DIAGNOSIS:  A. MESENTERIC MASS, MIDLINE, BIOPSY:  -Reactive lymphoid hyperplasia  -See comment    Procedure by Dr Rush Landmark 06/18/19  IMPRESSION EGD Impression: - No gross lesions in esophagus proximally. LA Grade C esophagitis with no bleeding distally and at [employer]. Biopsied. - Widely patent and non-obstructing Schatzki ring. - 3 cm hiatal hernia. - Erythematous mucosa in the gastric body, antrum and prepyloric region of the stomach. Biopsied. - No gross lesions in the duodenal bulb, in the first portion of the duodenum and in the second portion of the duodenum. EUS Impression: - A mass measuring 58 mm by 27 mm was identified endosonographically in the perigastric/peripancreatic region. Fine needle biopsy performed. - There was no sign of significant pathology in the pancreatic head, genu of the pancreas, pancreatic body and pancreatic tail. - There was no sign of significant pathology in the common bile duct and in the common hepatic duct. - Multiple stones were visualized endosonographically in the gallbladder. - No malignant-appearing lymph nodes were visualized in the celiac region (level 20), peripancreatic region and porta hepatis  region.  FINAL MICROSCOPIC DIAGNOSIS:  A. STOMACH, BIOPSY:  - Reactive gastropathy with erosions.  - Warthin-Starry is negative for Helicobacter pylori.  - No intestinal metaplasia, dysplasia, or malignancy.   B. DUODENUM, BIOPSY:  - Benign small bowel mucosa.  - No villous blunting or increase in intraepithelial lymphocytes.  - No dysplasia or malignancy.   C. ESOPHAGUS, DISTAL, BIOPSY:  - Reflux changes.  - No intestinal metaplasia, dysplasia, or malignancy.  FINAL MICROSCOPIC DIAGNOSIS:  - Lymphoid tissue present  - See comment    CT AP 10/02/19   IMPRESSION: 1. No substantial interval change in the central mesenteric mass lesion with adjacent 12 mm short axis central abdominal lymph node. 2. 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 3. Aortic Atherosclerosis (ICD10-I70.0).   Upper Endoscopy by Dr Rush Landmark 10/12/19  IMPRESSION - No gross lesions in esophagus - previous esophagitis has healed. Widely patent and nonobstructing Schatzki ring. Disrupted. Dilated. - No gross lesions in the stomach. - No gross lesions in the duodenal bulb, in the first portion of the duodenum and in the second portion of the duodenum. Diagnosis Surgical [P], esophagus - GASTROESOPHAGEAL MUCOSA WITH CHRONIC INFLAMMATION CONSISTENT WITH REFLUX. - NO INTESTINAL METAPLASIA, DYSPLASIA OR CARCINOMA.   RADIOGRAPHIC STUDIES: I have personally reviewed the radiological images as listed and agreed with the findings in the report. No results found.   ASSESSMENT & PLAN:  Amy Frost is a 45 y.o. female with    1. Mesenteric Mass  -Found incidentally on CT angio chest and CT AP from 04/23/19, which showed 5.9cm mesenteric mass. It does not appear to be involving attached to her small bowel, could be a lymph node but no other adenopathy on CT.  -Her 05/30/19 Chromogranin A, 24-hour urine 5 HIAA, CEA, CA 19.9,CA125 all normal; LDH slightly elevated but non-specific  -Her CT biopsy from 05/18/19 showed reactive lymphoid hyperplasia. I discussed it is very unusual for a reactive LN to be this large size, indolent lymphoma is not completed ruled out due to sampling issue. Other possibility of neuroendocrine tumor, or metastatic disease is less likely but not ruled out  -Following work up including 06/18/19 EUS and 10/18/19 EUS, were benign. Her 10/02/19 CT AP showed stable Mesenteric mass.  -This is likely benign, but an indolent lymphoma is not ruled out. No B symptoms. We discussed this would be difficult to surgically remove due to the location, she is  interested in surgical opinion.  ***   2. Mild Anemia  -She notes to having heavy menses every other month.  -Labs from 04/23/19 show Hg 11.9.  -I recommend she take multivitamin with iron.    3. Financial support -Shepreviouslyworked for [employer]. She is currently not working. She is nervous about working outside of home  -She has not had medical insurance for the past 2 years and has not been seen by PCP or Gyn. She did apply for insurance in 01/2019 with [employer] but nor sure if they cover anything.  -I discussed she can speak with our financial advocate or SW about more information.  -I recommend she establish a PCP under [facility]    PLAN: ***   No problem-specific Assessment & Plan notes found for this encounter.   No orders of the defined types were placed in this encounter.  All questions were answered. The patient knows to call the clinic with any problems, questions or concerns. No barriers to learning was detected. The total time spent in the appointment was {CHL ONC  TIME VISIT - [id number].     Joslyn Devon 04/02/2020   Oneal Deputy, am acting as scribe for Truitt Merle, MD.   {Add scribe attestation statement}

## 2020-04-04 ENCOUNTER — Inpatient Hospital Stay: Payer: 59 | Admitting: Hematology

## 2020-04-04 ENCOUNTER — Inpatient Hospital Stay: Payer: 59

## 2021-10-14 ENCOUNTER — Encounter (INDEPENDENT_AMBULATORY_CARE_PROVIDER_SITE_OTHER): Payer: Self-pay

## 2021-11-15 IMAGING — CT CT ANGIO CHEST
1 of 17 series · 8 of 38 positions shown · IV contrast (Omnipaque)
Comparison: None.

CLINICAL DATA: Shortness of breath

EXAM:
CT ANGIOGRAPHY CHEST WITH CONTRAST
TECHNIQUE: Multidetector CT imaging of the chest was performed using the
standard protocol during bolus administration of intravenous
contrast. Multiplanar CT image reconstructions and MIPs were
obtained to evaluate the vascular anatomy.
CONTRAST:  75mL OMNIPAQUE IOHEXOL 350 MG/ML SOLN

[Series 21: pe thins 2 · axial · 0.67mm/px · z∈[-259,-8]mm · 8 of 323 slices shown]
[im 36/323  lung]
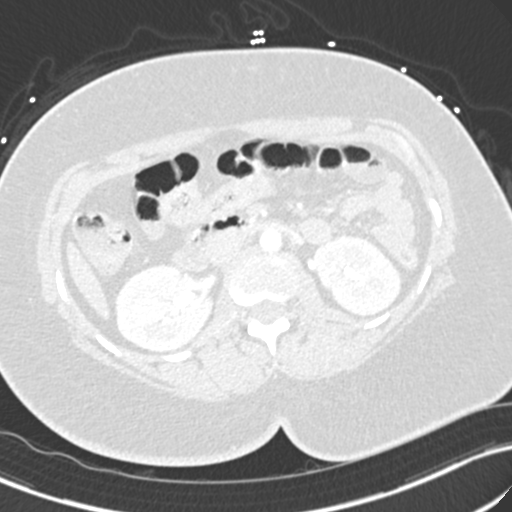
[im 72/323  mediastinal]
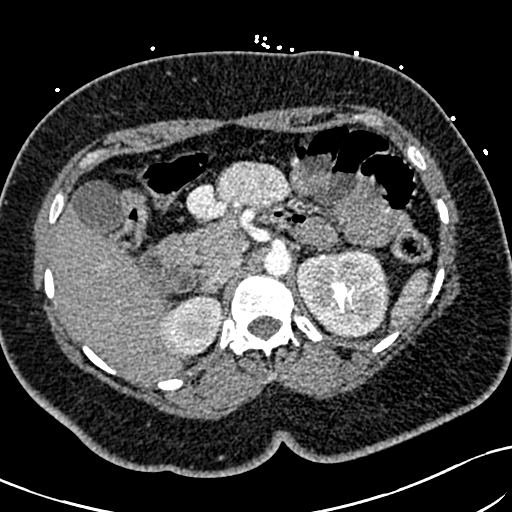
[im 108/323  lung]
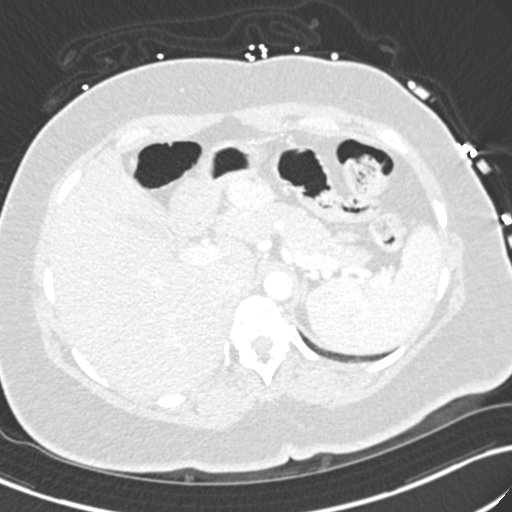
[im 144/323  mediastinal]
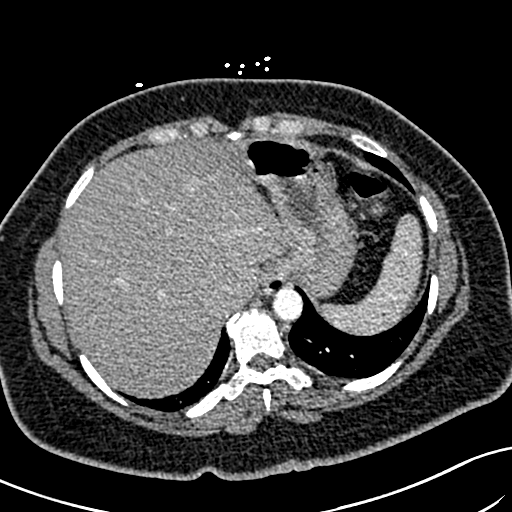
[im 179/323  lung]
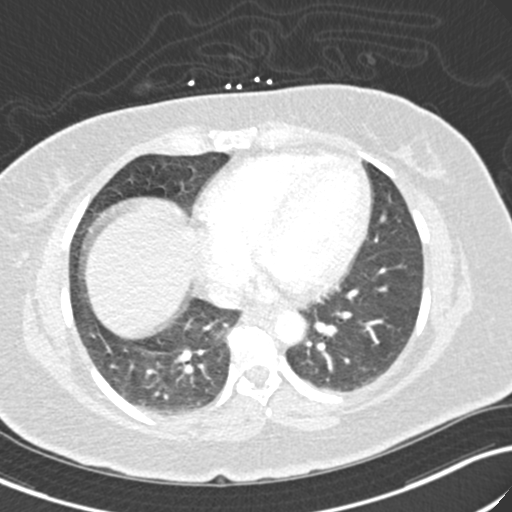
[im 215/323  mediastinal]
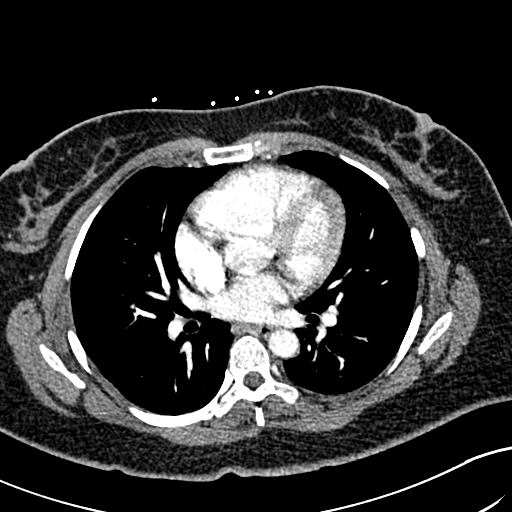
[im 251/323  lung]
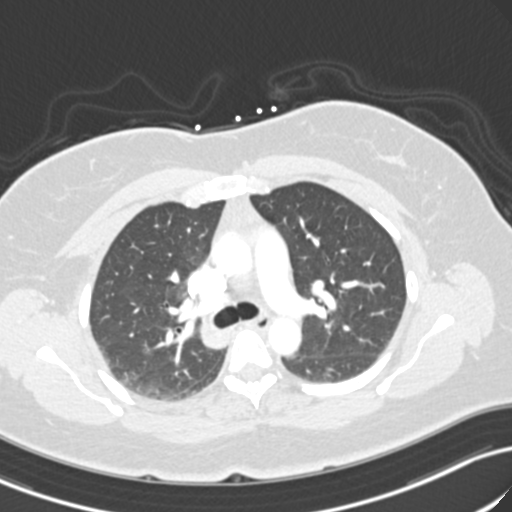
[im 287/323  mediastinal]
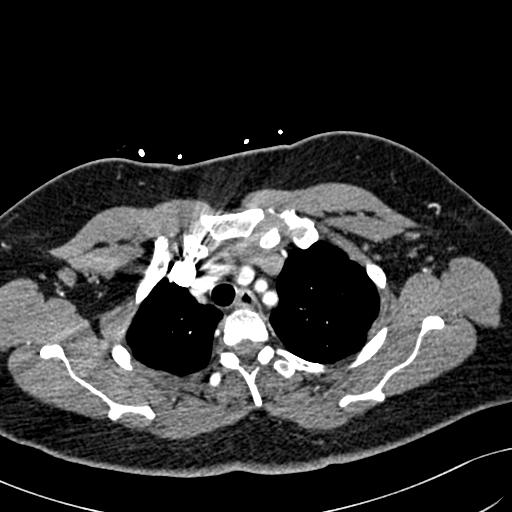

[8 of 38 positions shown; findings below may reference images not displayed]

FINDINGS: Cardiovascular: Evaluation for pulmonary emboli was limited by
respiratory motion artifact.Given this limitation, no definite acute
pulmonary embolism was detected. The main pulmonary artery is within
normal limits for size. There is no CT evidence of acute right heart
strain. The visualized aorta is normal. Heart size is normal,
without pericardial effusion.

Mediastinum/Nodes:

--No mediastinal or hilar lymphadenopathy.

--No axillary lymphadenopathy.

--No supraclavicular lymphadenopathy.

--Normal thyroid gland.

--The esophagus is unremarkable

Lungs/Pleura: No pulmonary nodules or masses. No pleural effusion or
pneumothorax. No focal airspace consolidation. No focal pleural
abnormality.

Upper Abdomen: There is a hypoattenuating 2.9 by 2.7 cm lesion in
hepatic segment [DATE] (axial series 6, image 57). There is suggestion
of peripheral nodular hyperenhancement surrounding this lesion and
is therefore favored to represent a benign hemangioma.There is a
hyperenhancing 5.2 by 3 cm mass in the anterior abdomen, anterior to
the SMA, in the root of the mesentery. There are multiple enlarged
draining veins that appear to connect to the portal venous
confluence.

Musculoskeletal: No chest wall abnormality. No acute or significant
osseous findings.

Review of the MIP images confirms the above findings.
IMPRESSION: 1. Limited study for the detection of pulmonary emboli as detailed
above. Given this limitation, no acute pulmonary embolism was
detected.
2. There is a 5.2 x 3 cm hyperenhancing mass in the abdomen as
detailed above. Differential considerations include but are not
limited to a desmoid or carcinoid tumor.
3. Indeterminate 3 cm mass in the right hepatic lobe. Follow-up with
a nonemergent contrast-enhanced MRI is recommended.

## 2021-12-10 IMAGING — CT CT BIOPSY
1 of 2 series · 15 of 32 positions shown, 19 images · non-contrast
Comparison: none

CLINICAL DATA: 5-6 cm midline mesenteric abdominal mass.

[Series 2: i-spiral 5.0 b31f · axial · 0.89mm/px · z∈[-244,+26]mm · 15 of 85 slices shown, 19 images]
[im 4/85  soft-tissue]
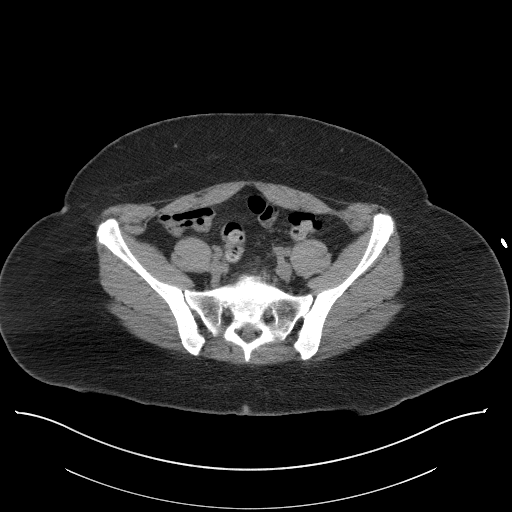
[im 4/85  bone]
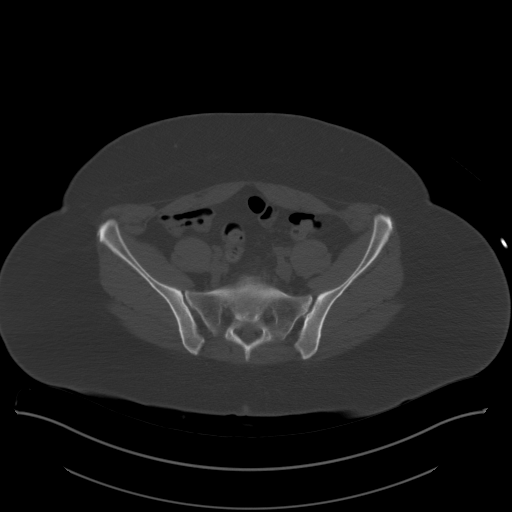
[im 11/85  soft-tissue]
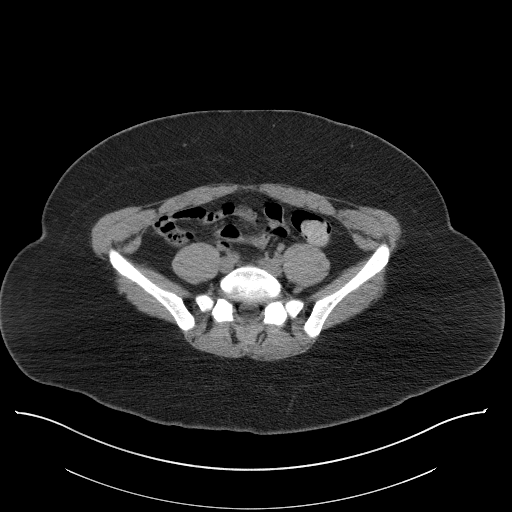
[im 19/85  soft-tissue]
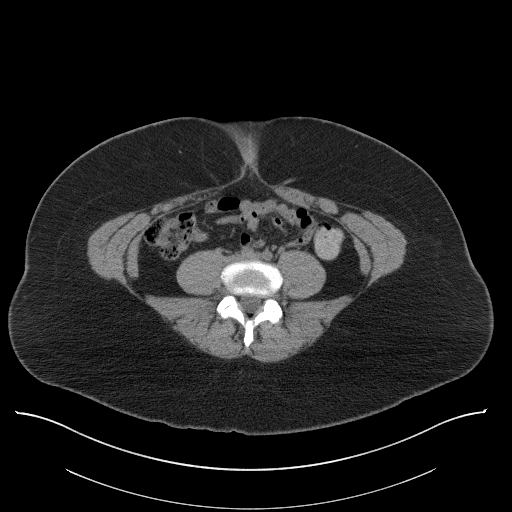
[im 22/85  soft-tissue]
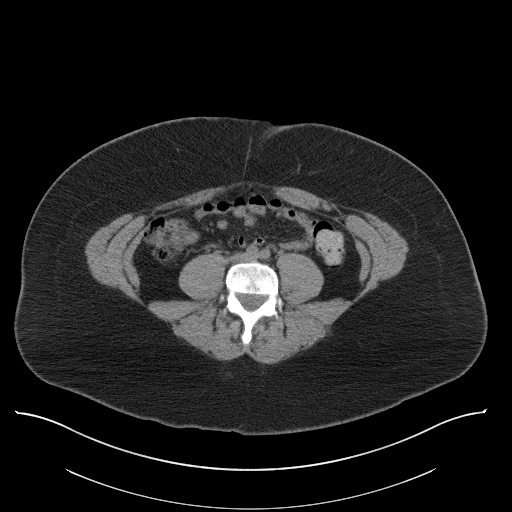
[im 30/85  soft-tissue]
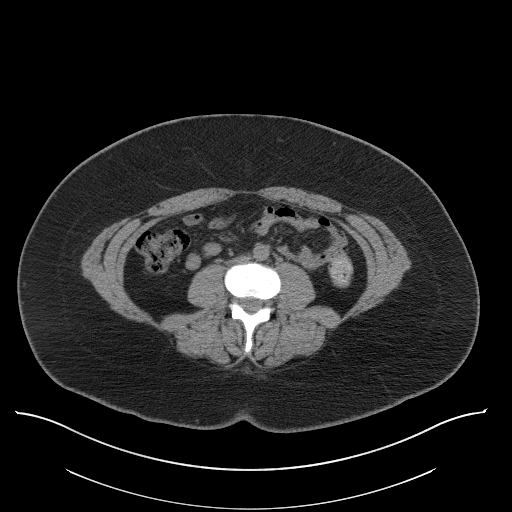
[im 37/85  soft-tissue]
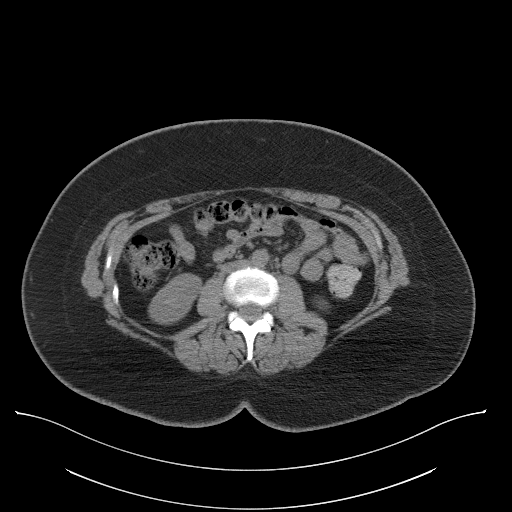
[im 44/85  soft-tissue]
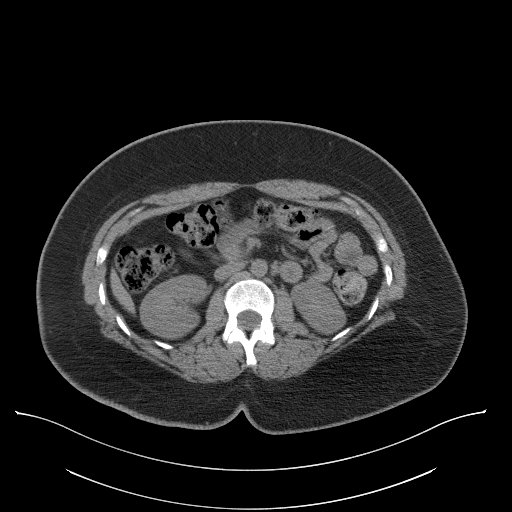
[im 48/85  soft-tissue]
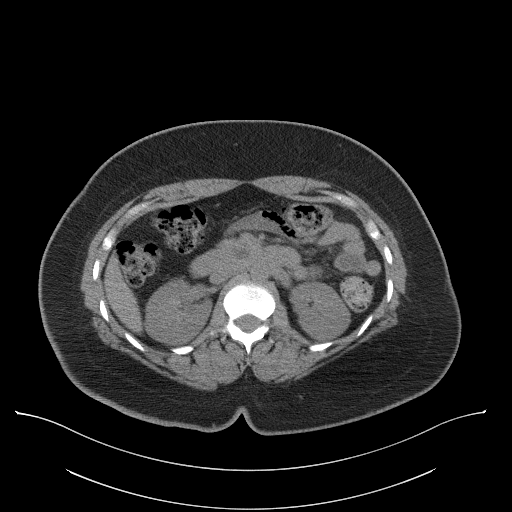
[im 55/85  soft-tissue]
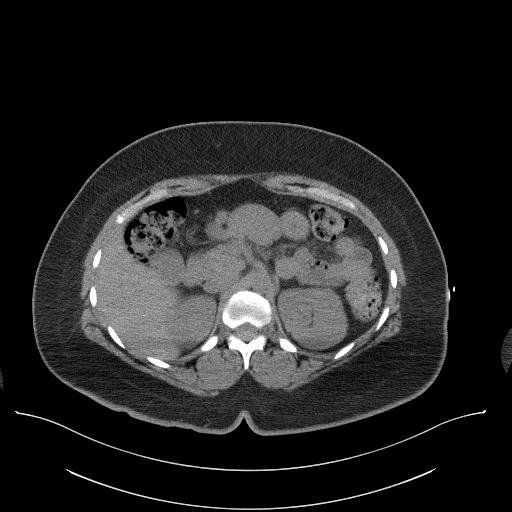
[im 55/85  bone]
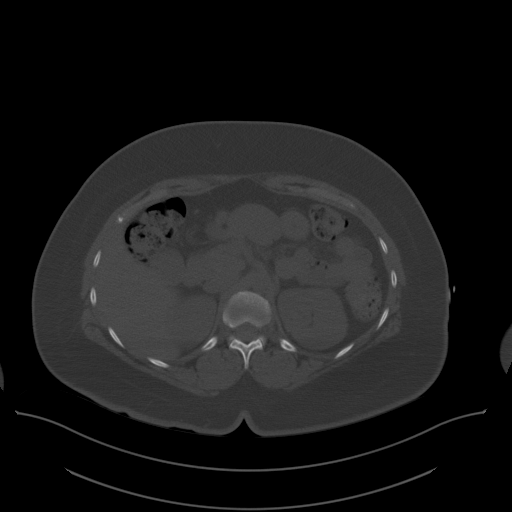
[im 63/85  soft-tissue]
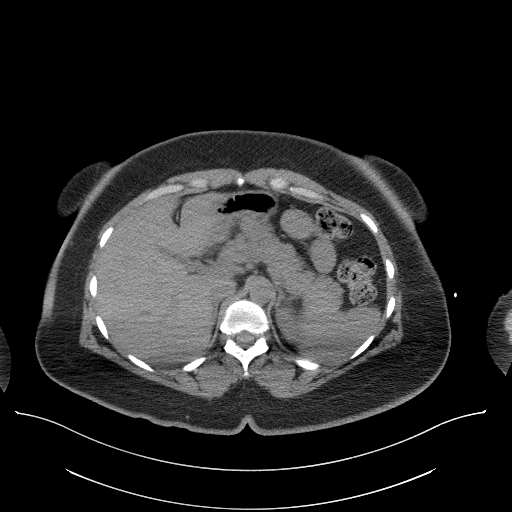
[im 66/85  soft-tissue]
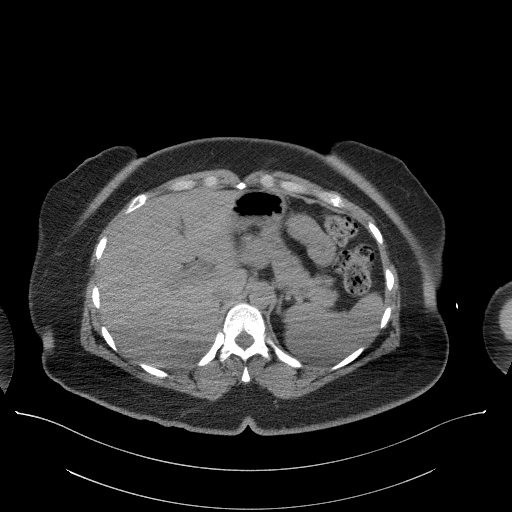
[im 70/85  lung]
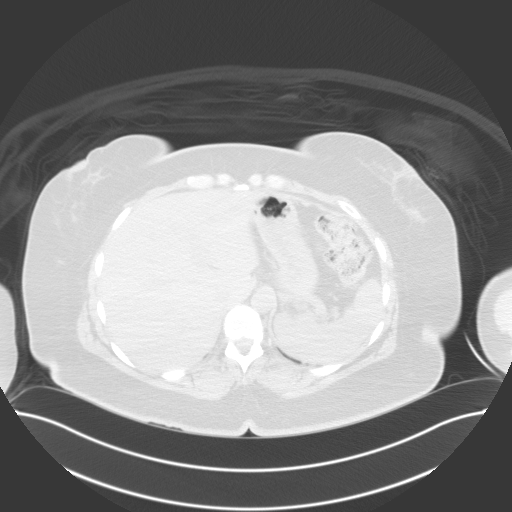
[im 74/85  soft-tissue]
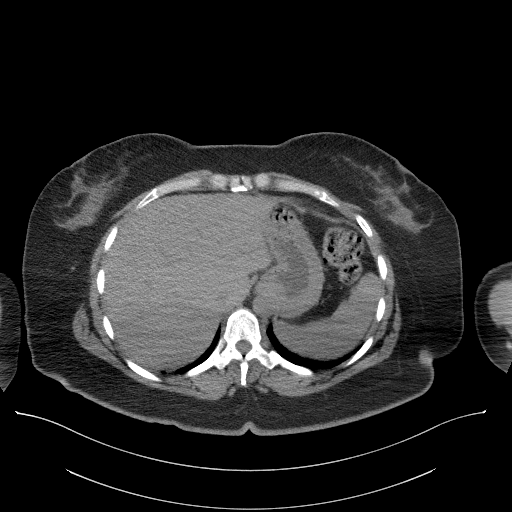
[im 74/85  lung]
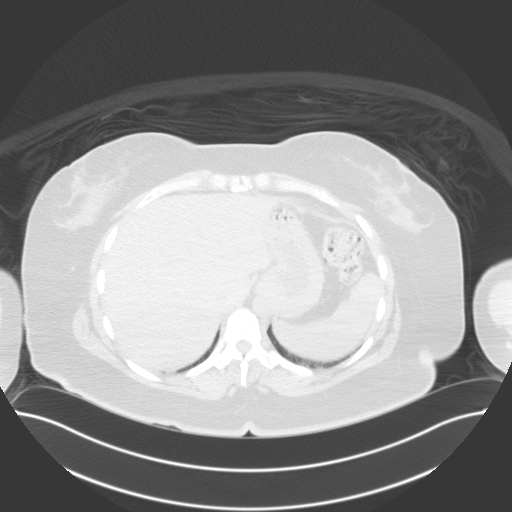
[im 77/85  lung]
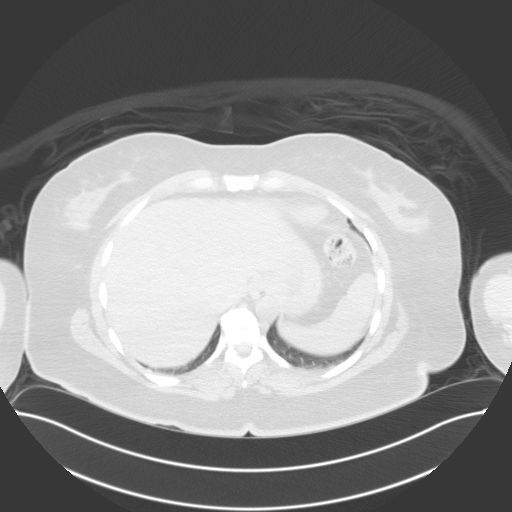
[im 81/85  soft-tissue]
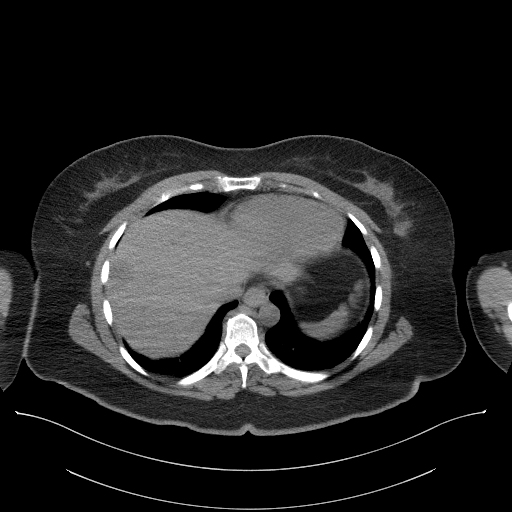
[im 81/85  lung]
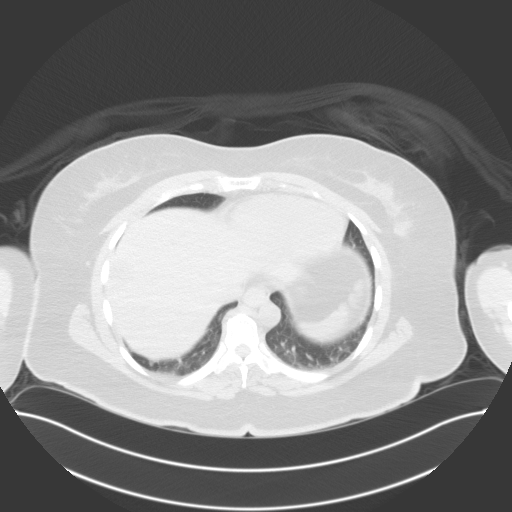

[15 of 32 positions shown; findings below may reference images not displayed]

EXAM:
CT GUIDED CORE BIOPSY OF MESENTERIC PERITONEAL MASS

ANESTHESIA/SEDATION:
4.0 mg IV Versed; 100 mcg IV Fentanyl

Total Moderate Sedation Time:  24 minutes.

The patient's level of consciousness and physiologic status were
continuously monitored during the procedure by Radiology nursing.

PROCEDURE:
The procedure risks, benefits, and alternatives were explained to
the patient. Questions regarding the procedure were encouraged and
answered. The patient understands and consents to the procedure. A
time-out was performed prior to initiating the procedure.

CT was performed through the upper to mid abdomen in a supine
position. The anterior abdominal wall was prepped with chlorhexidine
in a sterile fashion, and a sterile drape was applied covering the
operative field. A sterile gown and sterile gloves were used for the
procedure. Local anesthesia was provided with 1% Lidocaine.

Under CT guidance, a 17 gauge trocar needle was advanced from a
right anterior approach to the level of a midline mesenteric mass.
After confirming needle tip position, 2 separate coaxial 18 gauge
core biopsy samples were obtained. Gel-Foam pledgets were advanced
through the outer needle prior to needle removal. Additional CT was
performed.

COMPLICATIONS:
None
FINDINGS: Ovoid midline upper abdominal mesenteric mass is again noted
measuring approximately 5.4 cm in greatest transverse diameter on
axial images. Solid tissue was obtained with biopsy. After the
second core biopsy sample, there was some bleeding noted from the
outer needle necessitating administration of Gel-Foam pledgets.
IMPRESSION: CT-guided biopsy performed of upper abdominal midline peritoneal
mesenteric mass.
